# Patient Record
Sex: Female | Born: 1960 | Race: White | Hispanic: No | Marital: Married | Smoking: Never smoker
Health system: Southern US, Community
[De-identification: ages and names within clinical notes are randomized; demographics above are authoritative.]

## PROBLEM LIST (undated history)

## (undated) DIAGNOSIS — I10 Essential (primary) hypertension: Secondary | ICD-10-CM

## (undated) DIAGNOSIS — H509 Unspecified strabismus: Secondary | ICD-10-CM

## (undated) DIAGNOSIS — K56609 Unspecified intestinal obstruction, unspecified as to partial versus complete obstruction: Secondary | ICD-10-CM

## (undated) DIAGNOSIS — H4010X0 Unspecified open-angle glaucoma, stage unspecified: Secondary | ICD-10-CM

## (undated) DIAGNOSIS — E119 Type 2 diabetes mellitus without complications: Secondary | ICD-10-CM

## (undated) HISTORY — PX: ABDOMINAL HYSTERECTOMY: SHX81

---

## 1999-02-01 HISTORY — PX: VENTRAL HERNIA REPAIR: SHX424

## 2005-02-18 ENCOUNTER — Ambulatory Visit: Payer: Self-pay | Admitting: Unknown Physician Specialty

## 2007-08-02 ENCOUNTER — Ambulatory Visit: Payer: Self-pay | Admitting: Unknown Physician Specialty

## 2007-08-14 ENCOUNTER — Ambulatory Visit: Payer: Self-pay | Admitting: Unknown Physician Specialty

## 2008-02-07 ENCOUNTER — Ambulatory Visit: Payer: Self-pay | Admitting: Unknown Physician Specialty

## 2008-02-14 ENCOUNTER — Ambulatory Visit: Payer: Self-pay | Admitting: Unknown Physician Specialty

## 2008-08-14 ENCOUNTER — Ambulatory Visit: Payer: Self-pay | Admitting: Unknown Physician Specialty

## 2009-08-19 ENCOUNTER — Ambulatory Visit: Payer: Self-pay | Admitting: Unknown Physician Specialty

## 2009-10-14 ENCOUNTER — Emergency Department: Payer: Self-pay | Admitting: Emergency Medicine

## 2010-03-07 ENCOUNTER — Emergency Department: Payer: Self-pay | Admitting: Emergency Medicine

## 2010-10-13 ENCOUNTER — Ambulatory Visit: Payer: Self-pay | Admitting: Unknown Physician Specialty

## 2010-11-10 ENCOUNTER — Ambulatory Visit: Payer: Self-pay | Admitting: Unknown Physician Specialty

## 2012-01-19 ENCOUNTER — Ambulatory Visit: Payer: Self-pay | Admitting: Unknown Physician Specialty

## 2017-05-15 ENCOUNTER — Emergency Department: Payer: BC Managed Care – PPO

## 2017-05-15 ENCOUNTER — Inpatient Hospital Stay
Admission: EM | Admit: 2017-05-15 | Discharge: 2017-05-19 | DRG: 389 | Disposition: A | Discharge status: Home / Self Care | Payer: BC Managed Care – PPO | Attending: Surgery | Admitting: Surgery

## 2017-05-15 ENCOUNTER — Encounter: Payer: Self-pay | Admitting: Medical Oncology

## 2017-05-15 DIAGNOSIS — I10 Essential (primary) hypertension: Secondary | ICD-10-CM | POA: Diagnosis present

## 2017-05-15 DIAGNOSIS — Z79899 Other long term (current) drug therapy: Secondary | ICD-10-CM

## 2017-05-15 DIAGNOSIS — E119 Type 2 diabetes mellitus without complications: Secondary | ICD-10-CM | POA: Diagnosis present

## 2017-05-15 DIAGNOSIS — K56609 Unspecified intestinal obstruction, unspecified as to partial versus complete obstruction: Secondary | ICD-10-CM | POA: Diagnosis not present

## 2017-05-15 DIAGNOSIS — Z91010 Allergy to peanuts: Secondary | ICD-10-CM | POA: Diagnosis not present

## 2017-05-15 DIAGNOSIS — R1084 Generalized abdominal pain: Secondary | ICD-10-CM

## 2017-05-15 DIAGNOSIS — Z23 Encounter for immunization: Secondary | ICD-10-CM | POA: Diagnosis not present

## 2017-05-15 DIAGNOSIS — K432 Incisional hernia without obstruction or gangrene: Secondary | ICD-10-CM | POA: Diagnosis present

## 2017-05-15 DIAGNOSIS — Z6841 Body Mass Index (BMI) 40.0 and over, adult: Secondary | ICD-10-CM

## 2017-05-15 DIAGNOSIS — R109 Unspecified abdominal pain: Secondary | ICD-10-CM | POA: Diagnosis not present

## 2017-05-15 DIAGNOSIS — K5652 Intestinal adhesions [bands] with complete obstruction: Secondary | ICD-10-CM | POA: Diagnosis present

## 2017-05-15 DIAGNOSIS — Z7984 Long term (current) use of oral hypoglycemic drugs: Secondary | ICD-10-CM | POA: Diagnosis not present

## 2017-05-15 DIAGNOSIS — K436 Other and unspecified ventral hernia with obstruction, without gangrene: Secondary | ICD-10-CM

## 2017-05-15 HISTORY — DX: Type 2 diabetes mellitus without complications: E11.9

## 2017-05-15 HISTORY — DX: Essential (primary) hypertension: I10

## 2017-05-15 LAB — COMPREHENSIVE METABOLIC PANEL
ALBUMIN: 3.9 g/dL (ref 3.5–5.0)
ALT: 14 U/L (ref 14–54)
ANION GAP: 9 (ref 5–15)
AST: 20 U/L (ref 15–41)
Alkaline Phosphatase: 62 U/L (ref 38–126)
BUN: 16 mg/dL (ref 6–20)
CHLORIDE: 102 mmol/L (ref 101–111)
CO2: 25 mmol/L (ref 22–32)
CREATININE: 0.59 mg/dL (ref 0.44–1.00)
Calcium: 9.2 mg/dL (ref 8.9–10.3)
GFR calc non Af Amer: >60 mL/min (ref >60)
GLUCOSE: 165 mg/dL — AB (ref 65–99)
Potassium: 4.2 mmol/L (ref 3.5–5.1)
SODIUM: 136 mmol/L (ref 135–145)
Total Bilirubin: 0.6 mg/dL (ref 0.3–1.2)
Total Protein: 7.3 g/dL (ref 6.5–8.1)

## 2017-05-15 LAB — CBC
HCT: 40.2 % (ref 35.0–47.0)
HEMOGLOBIN: 13.6 g/dL (ref 12.0–16.0)
MCH: 31.9 pg (ref 26.0–34.0)
MCHC: 33.8 g/dL (ref 32.0–36.0)
MCV: 94.2 fL (ref 80.0–100.0)
Platelets: 227 10*3/uL (ref 150–440)
RBC: 4.27 MIL/uL (ref 3.80–5.20)
RDW: 13.4 % (ref 11.5–14.5)
WBC: 9.2 10*3/uL (ref 3.6–11.0)

## 2017-05-15 LAB — URINALYSIS, COMPLETE (UACMP) WITH MICROSCOPIC
BILIRUBIN URINE: NEGATIVE
Glucose, UA: 50 mg/dL — AB
Hgb urine dipstick: NEGATIVE
KETONES UR: 80 mg/dL — AB
Nitrite: NEGATIVE
PROTEIN: 30 mg/dL — AB
Specific Gravity, Urine: 1.035 — ABNORMAL HIGH (ref 1.005–1.030)
pH: 5 (ref 5.0–8.0)

## 2017-05-15 LAB — LIPASE, BLOOD: LIPASE: 27 U/L (ref 11–51)

## 2017-05-15 MED ORDER — MORPHINE SULFATE (PF) 4 MG/ML IV SOLN: 4.0000 mg | Freq: Once | INTRAVENOUS | Status: DC

## 2017-05-15 MED ORDER — LACTATED RINGERS IV SOLN
125.0000 mL/h | INTRAVENOUS | Status: DC
  Administered 2017-05-16 – 2017-05-17 (×5): 125 mL/h via INTRAVENOUS

## 2017-05-15 MED ORDER — ONDANSETRON HCL 4 MG/2ML IJ SOLN
INTRAMUSCULAR | Status: AC
  Administered 2017-05-15: 4 mg via INTRAVENOUS
  Filled 2017-05-15: qty 2

## 2017-05-15 MED ORDER — IOHEXOL 300 MG/ML  SOLN
125.0000 mL | Freq: Once | INTRAMUSCULAR | Status: AC | PRN
  Administered 2017-05-15: 125 mL via INTRAVENOUS

## 2017-05-15 MED ORDER — ONDANSETRON HCL 4 MG/2ML IJ SOLN: 4.0000 mg | Freq: Once | INTRAMUSCULAR | Status: DC

## 2017-05-15 MED ORDER — HYDROMORPHONE HCL 1 MG/ML IJ SOLN: 0.5000 mg | INTRAMUSCULAR | Status: DC | PRN

## 2017-05-15 MED ORDER — ONDANSETRON HCL 4 MG/2ML IJ SOLN: 4.0000 mg | Freq: Four times a day (QID) | INTRAMUSCULAR | Status: DC | PRN

## 2017-05-15 MED ORDER — MORPHINE SULFATE (PF) 4 MG/ML IV SOLN
INTRAVENOUS | Status: AC
  Administered 2017-05-15: 4 mg via INTRAVENOUS
  Filled 2017-05-15: qty 1

## 2017-05-15 MED ORDER — FAMOTIDINE IN NACL 20-0.9 MG/50ML-% IV SOLN
20.0000 mg | Freq: Two times a day (BID) | INTRAVENOUS | Status: DC
  Administered 2017-05-16 – 2017-05-18 (×7): 20 mg via INTRAVENOUS
  Filled 2017-05-15 (×7): qty 50

## 2017-05-15 MED ORDER — ENOXAPARIN SODIUM 40 MG/0.4ML ~~LOC~~ SOLN
40.0000 mg | SUBCUTANEOUS | Status: DC
  Administered 2017-05-16 – 2017-05-18 (×4): 40 mg via SUBCUTANEOUS
  Filled 2017-05-15 (×4): qty 0.4

## 2017-05-15 MED ORDER — ONDANSETRON 4 MG PO TBDP: 4.0000 mg | ORAL_TABLET | Freq: Four times a day (QID) | ORAL | Status: DC | PRN

## 2017-05-15 MED ORDER — LACTATED RINGERS IV BOLUS
1000.0000 mL | Freq: Once | INTRAVENOUS | Status: AC
  Administered 2017-05-15: 1000 mL via INTRAVENOUS
  Filled 2017-05-15: qty 1000

## 2017-05-15 MED ORDER — SODIUM CHLORIDE 0.9 % IV BOLUS
1000.0000 mL | Freq: Once | INTRAVENOUS | Status: AC
  Administered 2017-05-15: 1000 mL via INTRAVENOUS

## 2017-05-15 MED ORDER — MORPHINE SULFATE (PF) 4 MG/ML IV SOLN
4.0000 mg | Freq: Once | INTRAVENOUS | Status: AC
  Administered 2017-05-15: 4 mg via INTRAVENOUS

## 2017-05-15 MED ORDER — ONDANSETRON HCL 4 MG/2ML IJ SOLN
4.0000 mg | Freq: Once | INTRAMUSCULAR | Status: AC
  Administered 2017-05-15: 4 mg via INTRAVENOUS

## 2017-05-15 MED ORDER — HYDRALAZINE HCL 20 MG/ML IJ SOLN: 10.0000 mg | Freq: Four times a day (QID) | INTRAMUSCULAR | Status: DC | PRN

## 2017-05-15 MED ORDER — KETOROLAC TROMETHAMINE 30 MG/ML IJ SOLN
30.0000 mg | Freq: Four times a day (QID) | INTRAMUSCULAR | Status: DC
  Administered 2017-05-16 – 2017-05-17 (×7): 30 mg via INTRAVENOUS
  Filled 2017-05-15 (×7): qty 1

## 2017-05-15 MED ORDER — INSULIN ASPART 100 UNIT/ML ~~LOC~~ SOLN
0.0000 [IU] | SUBCUTANEOUS | Status: DC
  Administered 2017-05-16 (×2): 3 [IU] via SUBCUTANEOUS
  Filled 2017-05-15 (×2): qty 1

## 2017-05-15 NOTE — ED Triage Notes (Signed)
Pt reports she began having lower abd pain with vomiting Saturday night. Pt reports she "feels like everything is trapped" in her stomach. Last BM was this am but not normal.

## 2017-05-15 NOTE — ED Provider Notes (Signed)
[facility] Emergency Department Provider Note  ____________________________________________  Time seen: Approximately 7:21 PM  I have reviewed the triage vital signs and the nursing notes.   HISTORY  Chief Complaint Abdominal Pain; Emesis; and Constipation    HPI Holly Simon is a 57 y.o. female who complains of abdominal fullness and distention and pain. States that she has not been able to eat for the past 2 days. Decreased bowel movements. Vomiting. Symptoms are constant, waxing and waning, pain is worse with eating, no alleviating factors, nonradiating. Describes it worst is periumbilical in area of a previous hernia mesh repair.      Past Medical History:  Diagnosis Date  . Diabetes mellitus without complication (HCC)   . Hypertension      There are no active problems to display for this patient.    past surgical history Ventral hernia mesh repair   Prior to Admission medications   Medication Sig Start Date End Date Taking? Authorizing Provider  glimepiride (AMARYL) 2 MG tablet Take 2 mg by mouth 2 (two) times daily.   Yes [provider]  lisinopril (PRINIVIL,ZESTRIL) 10 MG tablet Take 10 mg by mouth daily.   Yes [provider]  Magnesium Oxide 250 MG TABS Take 250 mg by mouth 2 (two) times daily.   Yes [provider]  metFORMIN (GLUCOPHAGE) 1000 MG tablet Take 1,000-1,500 mg by mouth 2 (two) times daily with a meal. Take 1000 mg in the morning, 1500 mg at supper.   Yes [provider]  pioglitazone (ACTOS) 45 MG tablet Take 45 mg by mouth daily.   Yes [provider]  Potassium 99 MG TABS Take 1 tablet by mouth daily.   Yes [provider]     Allergies Peanut-containing drug products   No family history on file.  Social History Social History   Tobacco Use  . Smoking status: Not on file  Substance Use Topics  . Alcohol use: Not on file  . Drug use: Not on file     Review of Systems  Constitutional:   No fever or chills.  ENT:   No sore throat. No rhinorrhea. Cardiovascular:   No chest pain or syncope. Respiratory:   No dyspnea or cough. Gastrointestinal:   positive as above for abdominal pain and vomiting Musculoskeletal:   Negative for focal pain or swelling All other systems reviewed and are negative except as documented above in ROS and HPI.  ____________________________________________   PHYSICAL EXAM:  VITAL SIGNS: ED Triage Vitals [05/15/17 1214]  Enc Vitals Group     BP (!) 146/70     Pulse Rate 78     Resp 20     Temp 98.3 F (36.8 C)     Temp Source Oral     SpO2 100 %     Weight 300 lb (136.1 kg)     Height 5\' 4"  (1.626 m)     Head Circumference      Peak Flow      Pain Score 4     Pain Loc      Pain Edu?      Excl. in GC?     Vital signs reviewed, nursing assessments reviewed.   Constitutional:   Alert and oriented. not in distress. Eyes:   Conjunctivae are normal. EOMI. PERRL. ENT      Head:   Normocephalic and atraumatic.      Nose:   No congestion/rhinnorhea.  Mouth/Throat:   dry mucous membranes, no pharyngeal erythema. No peritonsillar mass.       Neck:   No meningismus. Full ROM. Hematological/Lymphatic/Immunilogical:   No cervical lymphadenopathy. Cardiovascular:   RRR. Symmetric bilateral radial and DP pulses.  No murmurs.  Respiratory:   Normal respiratory effort without tachypnea/retractions. Breath sounds are clear and equal bilaterally. No wheezes/rales/rhonchi. Gastrointestinal:   Soft with left lower quadrant tenderness. Morbidly obese. Non distended.   No rebound, rigidity, or guarding.there is a palpable mass in the central abdomen, hernia versus distended bowel loops.  Musculoskeletal:   Normal range of motion in all extremities. No joint effusions.  No lower extremity tenderness.  No edema. Neurologic:   Normal speech and language.  Motor grossly intact. No acute focal neurologic  deficits are appreciated.  Skin:    Skin is warm, dry and intact. No rash noted.  No petechiae, purpura, or bullae.  ____________________________________________    LABS (pertinent positives/negatives) (all labs ordered are listed, but only abnormal results are displayed) Labs Reviewed  COMPREHENSIVE METABOLIC PANEL - Abnormal; Notable for the following components:      Result Value   Glucose, Bld 165 (*)    All other components within normal limits  URINALYSIS, COMPLETE (UACMP) WITH MICROSCOPIC - Abnormal; Notable for the following components:   Color, Urine AMBER (*)    APPearance CLOUDY (*)    Specific Gravity, Urine 1.035 (*)    Glucose, UA 50 (*)    Ketones, ur 80 (*)    Protein, ur 30 (*)    Leukocytes, UA SMALL (*)    Bacteria, UA RARE (*)    Squamous Epithelial / LPF 0-5 (*)    Non Squamous Epithelial 0-5 (*)    All other components within normal limits  LIPASE, BLOOD  CBC   ____________________________________________   EKG    ____________________________________________    RADIOLOGY  Ct Abdomen Pelvis W Contrast  Result Date: 05/15/2017 CLINICAL DATA:  57 y/o  F; lower abdominal pain with vomiting. EXAM: CT ABDOMEN AND PELVIS WITH CONTRAST TECHNIQUE: Multidetector CT imaging of the abdomen and pelvis was performed using the standard protocol following bolus administration of intravenous contrast. CONTRAST:  OMNIPAQUE IOHEXOL 300 MG/ML  SOLN COMPARISON:  10/14/2009 CT abdomen and pelvis. FINDINGS: Lower chest: No acute abnormality. Hepatobiliary: No focal liver abnormality is seen. Cholelithiasis. No secondary signs of cholecystitis. No biliary ductal dilatation. Pancreas: Unremarkable. No pancreatic ductal dilatation or surrounding inflammatory changes. Spleen: Normal in size without focal abnormality. Adrenals/Urinary Tract: Adrenal glands are unremarkable. Kidneys are normal, without renal calculi, focal lesion, or hydronephrosis. Bladder is unremarkable.  Stomach/Bowel: Midline ventral anterior abdominal wall hernia with a neck measuring 4.9 x 3.7 cm (ML BY CC) containing multiple loops of small bowel. Loops of small bowel within the hernia and upstream in the peritoneum are dilated compatible with obstruction. There is a small volume of edema within the hernia sac. No findings of perforation. No obstructive or inflammatory changes of the colon. Appendix not identified. Vascular/Lymphatic: Aortic atherosclerosis. No enlarged abdominal or pelvic lymph nodes. Reproductive: Uterus and bilateral adnexa are unremarkable. Other: Small volume of ascites. Musculoskeletal: No fracture is seen. IMPRESSION: 1. Midline ventral abdominal wall hernia is increased in size containing multiple loops of small bowel. Small bowel within the hernia sac and upstream within the peritoneum is dilated compatible with obstruction. Mild associated edema. No perforation. 2. Cholelithiasis. 3. Aortic atherosclerosis. 4. Small volume of ascites. Electronically Signed   By: Buzzy Han.D.  On: 05/15/2017 19:24    ____________________________________________   PROCEDURES Procedures  ____________________________________________  DIFFERENTIAL DIAGNOSIS   bowel perforation, obstruction, hernia, diverticulitis  CLINICAL IMPRESSION / ASSESSMENT AND PLAN / ED COURSE  Pertinent labs & imaging results that were available during my care of the patient were reviewed by me and considered in my medical decision making (see chart for details).    patient with diabetes presents with abdominal pain and a ventral mass. Differential is broad given her underlying comorbidities, obesity, prior hernia surgery. There is evidence of dehydration based on exam as well as a urinalysis which shows ketones and urine concentration. We'll give IV fluids, obtain CT scan for further evaluation. On labs, no evidence of acidosis to suggest  DKA.   ----------------------------------------- 8:03 PM on 05/15/2017 -----------------------------------------  CT shows multiple loops of small intestine contained in ventral hernia sac with evidence of obstruction. This is compatible with patient's symptoms of worsening pain, by mouth intolerance, vomiting. Patient informed of results, we'll plan to place an NG tube and admit. Case discussed with surgery who will evaluate in the ED. I will give morphine and Zofran in the meantime for symptom control, IV fluid bolus for hydration. Based on CT and exam, presentation not currently consistent with strangulated hernia.      ____________________________________________   FINAL CLINICAL IMPRESSION(S) / ED DIAGNOSES    Final diagnoses:  Generalized abdominal pain  Ventral hernia with bowel obstruction     ED Discharge Orders    None      Portions of this note were generated with dragon dictation software. Dictation errors may occur despite best attempts at proofreading.    Sharman CheekStafford, Monque Haggar, MD 05/15/17 2004

## 2017-05-15 NOTE — H&P (Signed)
Date of Admission:  05/15/2017  Reason for Admission:  Small bowel obstruction  History of Present Illness: Holly Simon is a 57 y.o. female who presents with a 2-3 day history of nausea, vomiting, and some abdominal pain.  She reports that starting 4/13, she started feeling worse.  She reports decreased flatus and also decreased size bowel movements over the past two days.  Has not been able to tolerate po intake.  Denies having any fevers, chills, chest pain, shortness of breath.  She presented to the ED today and had labs and CT scan.  Labs overall were unremarkable, and CT scan showed a small bowel obstruction.  Patient has a ventral hernia with small bowel inside, with some dilated bowel loops going into the hernia, but decompressed coming out to terminal ileum.  NG tube was placed in the ED and the patient reports feeling much better now without any significant pain, and only some soreness.  Of note, patient has a history of a ventral hernia repair with mesh in 2001 in [place].  She has a CT scan from [facility] from 2011 which showed a recurrent hernia already with small bowel loops inside sac, though on today's CT scan there is a bit more bowel bulging through.  Past Medical History: Past Medical History:  Diagnosis Date  . Diabetes mellitus without complication (HCC)   . Hypertension      Past Surgical History: --Ventral hernia repair with mesh in 2001 --c section  Home Medications: Prior to Admission medications   Medication Sig Start Date End Date Taking? Authorizing Provider  glimepiride (AMARYL) 2 MG tablet Take 2 mg by mouth 2 (two) times daily.   Yes [provider]  lisinopril (PRINIVIL,ZESTRIL) 10 MG tablet Take 10 mg by mouth daily.   Yes [provider]  Magnesium Oxide 250 MG TABS Take 250 mg by mouth 2 (two) times daily.   Yes [provider]  metFORMIN (GLUCOPHAGE) 1000 MG tablet Take 1,000-1,500 mg by mouth 2 (two) times daily with a meal. Take  1000 mg in the morning, 1500 mg at supper.   Yes [provider]  pioglitazone (ACTOS) 45 MG tablet Take 45 mg by mouth daily.   Yes [provider]  Potassium 99 MG TABS Take 1 tablet by mouth daily.   Yes [provider]    Allergies: Allergies  Allergen Reactions  . Peanut-Containing Drug Products Nausea And Vomiting and Other (See Comments)    Bloating, Abdominal Pain.    Social History:  reports that she has never smoked. She has never used smokeless tobacco. She reports that she drank alcohol. She reports that she does not use drugs.   Family History: History of colon cancer, thyroid disease, macular degeneration, Alzheimer's, cardiac disease and MI.  Review of Systems: Review of Systems  Constitutional: Negative for chills and fever.  HENT: Negative for hearing loss.   Respiratory: Negative for shortness of breath.   Cardiovascular: Negative for chest pain.  Gastrointestinal: Positive for abdominal pain, nausea and vomiting. Negative for constipation and diarrhea.  Genitourinary: Negative for dysuria.  Musculoskeletal: Negative for myalgias.  Skin: Negative for rash.  Neurological: Negative for dizziness.  Psychiatric/Behavioral: Negative for depression.    Physical Exam BP (!) 152/78   Pulse 80   Temp 98.3 F (36.8 C) (Oral)   Resp 13   Ht 5\' 4"  (1.626 m)   Wt 300 lb (136.1 kg)   SpO2 96%   BMI 51.49 kg/m  CONSTITUTIONAL: No  acute distress HEENT:  Normocephalic, atraumatic, extraocular motion intact. NECK: Trachea is midline, and there is no jugular venous distension.  RESPIRATORY:  Lungs are clear, and breath sounds are equal bilaterally. Normal respiratory effort without pathologic use of accessory muscles. CARDIOVASCULAR: Heart is regular without murmurs, gallops, or rubs. GI: The abdomen is soft, obese, non-distended, non-tender to palpation.  Patient has a large ventral hernia with one area of some firmness to right of midline,  but no tenderness to palpation.  No skin discoloration or changes.  Prior hernia repair scar well healed. There were no palpable masses. NG tube in place with bilious gastric contents in canister. MUSCULOSKELETAL:  Normal muscle strength and tone in all four extremities.  No peripheral edema or cyanosis. SKIN: Skin turgor is normal. There are no pathologic skin lesions.  NEUROLOGIC:  Motor and sensation is grossly normal.  Cranial nerves are grossly intact. PSYCH:  Alert and oriented to person, place and time. Affect is normal.  Laboratory Analysis: Results for orders placed or performed during the hospital encounter of 05/15/17 (from the past 24 hour(s))  Lipase, blood     Status: None   Collection Time: 05/15/17  1:08 PM  Result Value Ref Range   Lipase 27 11 - 51 U/L  Comprehensive metabolic panel     Status: Abnormal   Collection Time: 05/15/17  1:08 PM  Result Value Ref Range   Sodium 136 135 - 145 mmol/L   Potassium 4.2 3.5 - 5.1 mmol/L   Chloride 102 101 - 111 mmol/L   CO2 25 22 - 32 mmol/L   Glucose, Bld 165 (H) 65 - 99 mg/dL   BUN 16 6 - 20 mg/dL   Creatinine, Ser 6.040.59 0.44 - 1.00 mg/dL   Calcium 9.2 8.9 - 54.010.3 mg/dL   Total Protein 7.3 6.5 - 8.1 g/dL   Albumin 3.9 3.5 - 5.0 g/dL   AST 20 15 - 41 U/L   ALT 14 14 - 54 U/L   Alkaline Phosphatase 62 38 - 126 U/L   Total Bilirubin 0.6 0.3 - 1.2 mg/dL   GFR calc non Af Amer >60 >60 mL/min   GFR calc Af Amer >60 >60 mL/min   Anion gap 9 5 - 15  CBC     Status: None   Collection Time: 05/15/17  1:08 PM  Result Value Ref Range   WBC 9.2 3.6 - 11.0 K/uL   RBC 4.27 3.80 - 5.20 MIL/uL   Hemoglobin 13.6 12.0 - 16.0 g/dL   HCT 98.140.2 19.135.0 - 47.847.0 %   MCV 94.2 80.0 - 100.0 fL   MCH 31.9 26.0 - 34.0 pg   MCHC 33.8 32.0 - 36.0 g/dL   RDW 29.513.4 62.111.5 - 30.814.5 %   Platelets 227 150 - 440 K/uL  Urinalysis, Complete w Microscopic     Status: Abnormal   Collection Time: 05/15/17  5:01 PM  Result Value Ref Range   Color, Urine AMBER (A)  YELLOW   APPearance CLOUDY (A) CLEAR   Specific Gravity, Urine 1.035 (H) 1.005 - 1.030   pH 5.0 5.0 - 8.0   Glucose, UA 50 (A) NEGATIVE mg/dL   Hgb urine dipstick NEGATIVE NEGATIVE   Bilirubin Urine NEGATIVE NEGATIVE   Ketones, ur 80 (A) NEGATIVE mg/dL   Protein, ur 30 (A) NEGATIVE mg/dL   Nitrite NEGATIVE NEGATIVE   Leukocytes, UA SMALL (A) NEGATIVE   RBC / HPF 0-5 0 - 5 RBC/hpf   WBC, UA TOO NUMEROUS TO COUNT  0 - 5 WBC/hpf   Bacteria, UA RARE (A) NONE SEEN   Squamous Epithelial / LPF 0-5 (A) NONE SEEN   Mucus PRESENT    Non Squamous Epithelial 0-5 (A) NONE SEEN    Imaging: Ct Abdomen Pelvis W Contrast  Result Date: 05/15/2017 CLINICAL DATA:  57 y/o  F; lower abdominal pain with vomiting. EXAM: CT ABDOMEN AND PELVIS WITH CONTRAST TECHNIQUE: Multidetector CT imaging of the abdomen and pelvis was performed using the standard protocol following bolus administration of intravenous contrast. CONTRAST:  OMNIPAQUE IOHEXOL 300 MG/ML  SOLN COMPARISON:  10/14/2009 CT abdomen and pelvis. FINDINGS: Lower chest: No acute abnormality. Hepatobiliary: No focal liver abnormality is seen. Cholelithiasis. No secondary signs of cholecystitis. No biliary ductal dilatation. Pancreas: Unremarkable. No pancreatic ductal dilatation or surrounding inflammatory changes. Spleen: Normal in size without focal abnormality. Adrenals/Urinary Tract: Adrenal glands are unremarkable. Kidneys are normal, without renal calculi, focal lesion, or hydronephrosis. Bladder is unremarkable. Stomach/Bowel: Midline ventral anterior abdominal wall hernia with a neck measuring 4.9 x 3.7 cm (ML BY CC) containing multiple loops of small bowel. Loops of small bowel within the hernia and upstream in the peritoneum are dilated compatible with obstruction. There is a small volume of edema within the hernia sac. No findings of perforation. No obstructive or inflammatory changes of the colon. Appendix not identified. Vascular/Lymphatic:  Aortic atherosclerosis. No enlarged abdominal or pelvic lymph nodes. Reproductive: Uterus and bilateral adnexa are unremarkable. Other: Small volume of ascites. Musculoskeletal: No fracture is seen. IMPRESSION: 1. Midline ventral abdominal wall hernia is increased in size containing multiple loops of small bowel. Small bowel within the hernia sac and upstream within the peritoneum is dilated compatible with obstruction. Mild associated edema. No perforation. 2. Cholelithiasis. 3. Aortic atherosclerosis. 4. Small volume of ascites. Electronically Signed   By: Mitzi Hansen M.D.   On: 05/15/2017 19:24    Assessment and Plan: This is a 57 y.o. female who presents with small bowel obstruction.  I have independently viewed the patient's imaging study and reviewed her laboratory studies as mentioned in HPI.  Discussed with the patient that she'd be admitted to the surgical team for management of her small bowel obstruction.  She will be NPO with IV fluid hydration.  NG tube will remain in place until return of bowel function.  She will also have appropriate pain and nausea control.  She takes 3 medications for her diabetes.  Will have her on high dose insulin sliding scale and may need hospitalist consult depending on how this can control her glucose.  Will have IV hydralazine for HTN.  Discussed with patient that currently there is no urgency for surgery given that she's not peritoneal and there is no evidence of strangulation of bowel inside hernia sac.  Will attempt conservative management.  The patient does understand that if there is no improvement with conservative management, she may require surgical intervention.  For now, continue NG tube and NPO.  Patient understands this plan and all of her questions have been answered.   Howie Ill, MD [facility]

## 2017-05-16 ENCOUNTER — Encounter: Payer: Self-pay | Admitting: *Deleted

## 2017-05-16 ENCOUNTER — Other Ambulatory Visit: Payer: Self-pay

## 2017-05-16 ENCOUNTER — Inpatient Hospital Stay: Payer: BC Managed Care – PPO

## 2017-05-16 LAB — BASIC METABOLIC PANEL
ANION GAP: 6 (ref 5–15)
BUN: 16 mg/dL (ref 6–20)
CO2: 25 mmol/L (ref 22–32)
Calcium: 8.5 mg/dL — ABNORMAL LOW (ref 8.9–10.3)
Chloride: 103 mmol/L (ref 101–111)
Creatinine, Ser: 0.64 mg/dL (ref 0.44–1.00)
GFR calc Af Amer: >60 mL/min (ref >60)
GLUCOSE: 133 mg/dL — AB (ref 65–99)
POTASSIUM: 3.3 mmol/L — AB (ref 3.5–5.1)
SODIUM: 134 mmol/L — AB (ref 135–145)

## 2017-05-16 LAB — CBC WITH DIFFERENTIAL/PLATELET
BASOS ABS: 0 10*3/uL (ref 0–0.1)
BASOS PCT: 0 %
Eosinophils Absolute: 0 10*3/uL (ref 0–0.7)
Eosinophils Relative: 0 %
HCT: 35.8 % (ref 35.0–47.0)
HEMOGLOBIN: 12.2 g/dL (ref 12.0–16.0)
Lymphocytes Relative: 23 %
Lymphs Abs: 1.8 10*3/uL (ref 1.0–3.6)
MCH: 32.1 pg (ref 26.0–34.0)
MCHC: 34.1 g/dL (ref 32.0–36.0)
MCV: 94.1 fL (ref 80.0–100.0)
Monocytes Absolute: 0.8 10*3/uL (ref 0.2–0.9)
Monocytes Relative: 10 %
NEUTROS ABS: 5.1 10*3/uL (ref 1.4–6.5)
NEUTROS PCT: 67 %
Platelets: 228 10*3/uL (ref 150–440)
RBC: 3.8 MIL/uL (ref 3.80–5.20)
RDW: 13.4 % (ref 11.5–14.5)
WBC: 7.8 10*3/uL (ref 3.6–11.0)

## 2017-05-16 LAB — GLUCOSE, CAPILLARY
GLUCOSE-CAPILLARY: 101 mg/dL — AB (ref 65–99)
GLUCOSE-CAPILLARY: 118 mg/dL — AB (ref 65–99)
GLUCOSE-CAPILLARY: 92 mg/dL (ref 65–99)
Glucose-Capillary: 105 mg/dL — ABNORMAL HIGH (ref 65–99)
Glucose-Capillary: 127 mg/dL — ABNORMAL HIGH (ref 65–99)
Glucose-Capillary: 130 mg/dL — ABNORMAL HIGH (ref 65–99)

## 2017-05-16 LAB — MAGNESIUM: MAGNESIUM: 1.7 mg/dL (ref 1.7–2.4)

## 2017-05-16 MED ORDER — PNEUMOCOCCAL VAC POLYVALENT 25 MCG/0.5ML IJ INJ
0.5000 mL | INJECTION | INTRAMUSCULAR | Status: AC
  Administered 2017-05-17: 0.5 mL via INTRAMUSCULAR
  Filled 2017-05-16: qty 0.5

## 2017-05-16 NOTE — Progress Notes (Signed)
SURGICAL PROGRESS NOTE (cpt [phone])  Hospital Day(s): 1.   Post op day(s):  Marland Kitchen   Interval History: Patient seen and examined, no acute events or new complaints overnight. Patient reports she started passing some flatus with minimal remaining peri-umbilical pain, expresses frustration with long-standing difficulty with weight loss, requests bariatric program seminar information. She otherwise denies N/V, fever/chills, CP, or SOB.  Review of Systems:  Constitutional: denies fever, chills  HEENT: denies cough or congestion  Respiratory: denies any shortness of breath  Cardiovascular: denies chest pain or palpitations  Gastrointestinal: abdominal pain, N/V, and bowel function as per interval history Genitourinary: denies burning with urination or urinary frequency Musculoskeletal: denies pain, decreased motor or sensation Integumentary: denies any other rashes or skin discolorations Neurological: denies HA or vision/hearing changes   Vital signs in last 24 hours: [min-max] current  Temp:  [98 F (36.7 C)-98.6 F (37 C)] 98 F (36.7 C) (04/16 1100) Pulse Rate:  [68-87] 77 (04/16 1100) Resp:  [11-21] 18 (04/16 1100) BP: (128-159)/(56-92) 128/59 (04/16 1100) SpO2:  [93 %-100 %] 94 % (04/16 1100) Weight:  [307 lb 5.1 oz (139.4 kg)] 307 lb 5.1 oz (139.4 kg) (04/16 0225)     Height: 5\' 4"  (162.6 cm) Weight: (!) 307 lb 5.1 oz (139.4 kg) BMI (Calculated): 52.73   Intake/Output this shift:  Total I/O In: 215 [I.V.:215] Out: 200 [Urine:200]   Intake/Output last 2 shifts:  @IOLAST2SHIFTS @   Physical Exam:  Constitutional: alert, cooperative and no distress  HENT: normocephalic without obvious abnormality  Eyes: PERRL, EOM's grossly intact and symmetric  Neuro: CN II - XII grossly intact and symmetric without deficit  Respiratory: breathing non-labored at rest  Cardiovascular: regular rate and sinus rhythm  Gastrointestinal: soft, obese, but non-distended, with mild peri-umbilical  tenderness with even deep palpation, easily reducible large ventral abdominal wall hernia Musculoskeletal: UE and LE FROM, motor and sensation grossly intact, NT   Labs:  CBC Latest Ref Rng & Units 05/16/2017 05/15/2017  WBC 3.6 - 11.0 K/uL 7.8 9.2  Hemoglobin 12.0 - 16.0 g/dL 19.1 47.8  Hematocrit 29.5 - 47.0 % 35.8 40.2  Platelets 150 - 440 K/uL 228 227   CMP Latest Ref Rng & Units 05/16/2017 05/15/2017  Glucose 65 - 99 mg/dL 621(H) 086(V)  BUN 6 - 20 mg/dL 16 16  Creatinine 7.84 - 1.00 mg/dL 6.96 2.95  Sodium 284 - 145 mmol/L 134(L) 136  Potassium 3.5 - 5.1 mmol/L 3.3(L) 4.2  Chloride 101 - 111 mmol/L 103 102  CO2 22 - 32 mmol/L 25 25  Calcium 8.9 - 10.3 mg/dL 1.3(K) 9.2  Total Protein 6.5 - 8.1 g/dL - 7.3  Total Bilirubin 0.3 - 1.2 mg/dL - 0.6  Alkaline Phos 38 - 126 U/L - 62  AST 15 - 41 U/L - 20  ALT 14 - 54 U/L - 14   Imaging studies:  Abdominal X-ray (05/16/2017) - personally reviewed, compared to admission CT, and discussed with patient Mildly dilated loops of central small bowel up to 4.3 cm. Residual contrast within the renal collecting systems. Scattered colon gas is noted. Bowel loops in the left lower quadrant lateral to pelvic bones, corresponding to ventral hernia noted on CT.   Assessment/Plan: (ICD-10's: K86.52) 57 y.o. 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  - NPO for now, IV fluids             -  continue NG tube for nasogastric decompression             - monitor ongoing bowel function and abdominal exam              - anticipate symptomatic relief within 24 - 48 hours following NGT insertion, followed by "rumbling" the following day and flatus either the same day or the day following the "rumbling" with anticipated length of stay ~3  - 5 days with successful non-operative management for 8 of 10 patients with small bowel obstruction attributed to post-surgical adhesions  - surgical intervention if doesn't improve was also discussed             - medical management of medical comorbidities             - ambulation encouraged              - DVT prophylaxis  -- Barbara CowerJason E. Earlene Plateravis, MD, RPVI Rami[place]o: [facility] - Partnering for exceptional care. Office: [phone]

## 2017-05-17 LAB — GLUCOSE, CAPILLARY
GLUCOSE-CAPILLARY: 84 mg/dL (ref 65–99)
Glucose-Capillary: 78 mg/dL (ref 65–99)
Glucose-Capillary: 76 mg/dL (ref 65–99)
Glucose-Capillary: 79 mg/dL (ref 65–99)
Glucose-Capillary: 85 mg/dL (ref 65–99)
Glucose-Capillary: 129 mg/dL — ABNORMAL HIGH (ref 65–99)

## 2017-05-17 LAB — HIV ANTIBODY (ROUTINE TESTING W REFLEX): HIV SCREEN 4TH GENERATION: NONREACTIVE

## 2017-05-17 MED ORDER — INSULIN ASPART 100 UNIT/ML ~~LOC~~ SOLN
0.0000 [IU] | Freq: Three times a day (TID) | SUBCUTANEOUS | Status: DC
  Administered 2017-05-19: 3 [IU] via SUBCUTANEOUS
  Filled 2017-05-17: qty 1

## 2017-05-17 MED ORDER — PHENOL 1.4 % MT LIQD
1.0000 | OROMUCOSAL | Status: DC | PRN
  Administered 2017-05-17: 1 via OROMUCOSAL
  Filled 2017-05-17: qty 177

## 2017-05-17 MED ORDER — POTASSIUM CHLORIDE IN NACL 20-0.45 MEQ/L-% IV SOLN
INTRAVENOUS | Status: DC
  Administered 2017-05-17 – 2017-05-19 (×3): via INTRAVENOUS
  Filled 2017-05-17 (×6): qty 1000

## 2017-05-17 NOTE — Progress Notes (Signed)
SURGICAL PROGRESS NOTE (cpt [id number])  Hospital Day(s): 2.   Post op day(s):  Marland Kitchen.   Interval History: Patient seen and examined, no acute events or new complaints overnight. Patient reports ongoing +flatus with resolution of nausea and abdominal pain, denies fever/chills, CP, or SOB and again expresses interest in bariatric information.  Review of Systems:  Constitutional: denies fever, chills  HEENT: denies cough or congestion  Respiratory: denies any shortness of breath  Cardiovascular: denies chest pain or palpitations  Gastrointestinal: abdominal pain, N/V, and bowel function as per interval history Genitourinary: denies burning with urination or urinary frequency Musculoskeletal: denies pain, decreased motor or sensation Integumentary: denies any other rashes or skin discolorations Neurological: denies HA or vision/hearing changes   Vital signs in last 24 hours: [min-max] current  Temp:  [98 F (36.7 C)-98.5 F (36.9 C)] 98.5 F (36.9 C) (04/17 0546) Pulse Rate:  [67-77] 67 (04/17 0546) Resp:  [18-20] 20 (04/17 0546) BP: (122-144)/(59-88) 122/71 (04/17 0546) SpO2:  [94 %-100 %] 95 % (04/17 0546)     Height: 5\' 4"  (162.6 cm) Weight: (!) 307 lb 5.1 oz (139.4 kg) BMI (Calculated): 52.73   Intake/Output this shift:  Total I/O In: 1341 [I.V.:1341] Out: -    Intake/Output last 2 shifts:  @IOLAST2SHIFTS @   Physical Exam:  Constitutional: alert, cooperative and no distress  HENT: normocephalic without obvious abnormality  Eyes: PERRL, EOM's grossly intact and symmetric  Neuro: CN II - XII grossly intact and symmetric without deficit  Respiratory: breathing non-labored at rest  Cardiovascular: regular rate and sinus rhythm  Gastrointestinal: soft and morbidly obese, non-tender and non-distended with reducible large ventral hernia Musculoskeletal: UE and LE FROM, motor and sensation grossly intact, NT   Labs:  CBC Latest Ref Rng & Units 05/16/2017 05/15/2017  WBC 3.6 - 11.0  K/uL 7.8 9.2  Hemoglobin 12.0 - 16.0 g/dL 60.412.2 54.013.6  Hematocrit 98.135.0 - 47.0 % 35.8 40.2  Platelets 150 - 440 K/uL 228 227   CMP Latest Ref Rng & Units 05/16/2017 05/15/2017  Glucose 65 - 99 mg/dL 191(Y133(H) 782(N165(H)  BUN 6 - 20 mg/dL 16 16  Creatinine 5.620.44 - 1.00 mg/dL 1.300.64 8.650.59  Sodium 784135 - 145 mmol/L 134(L) 136  Potassium 3.5 - 5.1 mmol/L 3.3(L) 4.2  Chloride 101 - 111 mmol/L 103 102  CO2 22 - 32 mmol/L 25 25  Calcium 8.9 - 10.3 mg/dL 6.9(G8.5(L) 9.2  Total Protein 6.5 - 8.1 g/dL - 7.3  Total Bilirubin 0.3 - 1.2 mg/dL - 0.6  Alkaline Phos 38 - 126 U/L - 62  AST 15 - 41 U/L - 20  ALT 14 - 54 U/L - 14   Imaging studies: No new pertinent imaging studies   Assessment/Plan: (ICD-10's: K56.52) 57 y.o. femalewith resolving complete small bowel obstruction, likely attributable to post-surgical adhesions following c-section and remote open repair of large ventral hernia with mesh (2001), complicated by pertinent comorbidities including morbid obesity (BMI 53), DM, and HTN.  - remove NG tube and start clear liquids diet - monitor ongoing bowel function and abdominal exam  - medical management of medical comorbidities - DVT prophylaxis, ambulation encouraged  All of the above findings and recommendations were discussed with the patient and her RN, and all of patient's questions were answered to her expressed satisfaction.  -- Scherrie GerlachJason E. Earlene Plateravis, MD, RPVI [place]: [facility] General Surgery - Partnering for exceptional care. Office: [phone]

## 2017-05-18 LAB — GLUCOSE, CAPILLARY
GLUCOSE-CAPILLARY: 88 mg/dL (ref 65–99)
GLUCOSE-CAPILLARY: 98 mg/dL (ref 65–99)
GLUCOSE-CAPILLARY: 156 mg/dL — AB (ref 65–99)
Glucose-Capillary: 112 mg/dL — ABNORMAL HIGH (ref 65–99)

## 2017-05-18 NOTE — Progress Notes (Deleted)
Patient scheduled to have EGD performed 05/19/17, but is stating that she can not tolerate the golytely before attempting to drink any. Patient given anti- nausea med via IV and told by nurse to see if that works and she can tolerate the golytely. New orders given for wound care under breast from wound care nurse refer to orders.

## 2017-05-19 DIAGNOSIS — K5652 Intestinal adhesions [bands] with complete obstruction: Principal | ICD-10-CM

## 2017-05-19 LAB — GLUCOSE, CAPILLARY: Glucose-Capillary: 132 mg/dL — ABNORMAL HIGH (ref 65–99)

## 2017-05-19 NOTE — Progress Notes (Signed)
Holly Simon  A and O x 4. VSS. Pt tolerating diet well. No complaints of pain or nausea. IV removed intact, prescriptions given. Pt voiced understanding of discharge instructions with no further questions. Pt discharged via wheelchair with nurse aide.  Holly FeilAbbie Rosalea Withrow MSN, RN-BC  Allergies as of 05/19/2017      Reactions   Peanut-containing Drug Products Nausea And Vomiting, Other (See Comments)   Bloating, Abdominal Pain.      Medication List    TAKE these medications   glimepiride 2 MG tablet Commonly known as:  AMARYL Take 2 mg by mouth 2 (two) times daily.   lisinopril 10 MG tablet Commonly known as:  PRINIVIL,ZESTRIL Take 10 mg by mouth daily.   Magnesium Oxide 250 MG Tabs Take 250 mg by mouth 2 (two) times daily.   metFORMIN 1000 MG tablet Commonly known as:  GLUCOPHAGE Take 1,000-1,500 mg by mouth 2 (two) times daily with a meal. Take 1000 mg in the morning, 1500 mg at supper.   pioglitazone 45 MG tablet Commonly known as:  ACTOS Take 45 mg by mouth daily.   Potassium 99 MG Tabs Take 1 tablet by mouth daily.       Vitals:   05/19/17 0501 05/19/17 0757  BP: 140/62   Pulse: 90   Resp: 16   Temp: (!) 100.4 F (38 C) 98.6 F (37 C)  SpO2: 95%

## 2017-05-20 NOTE — Discharge Summary (Signed)
Physician Discharge Summary  Patient ID: Holly Simon MRN: [id number] DOB/AGE: 1960/08/13 57 y.o.  Admit date: 05/15/2017 Discharge date: 05/20/2017  Admission Diagnoses:  Discharge Diagnoses:  Active Problems:   Small bowel obstruction [facility](HCC)   Discharged Condition: good  Hospital Course: 57 y.o. female [facility] ED for abdominal pain and nausea. Workup was found to be significant for CT imaging demonstrating small bowel obstruction attributable to post-surgical intra-abdominal adhesions with non-obstructing ventral abdominal wall hernia. NG tube was placed, and patient's pain, nausea, and abdominal distention all improved/resolved and advancement of patient's diet and ambulation were well-tolerated. The remainder of patient's hospital course was essentially unremarkable, and discharge planning was initiated accordingly with patient safely able to be discharged home with appropriate discharge instructions after all of her questions were answered to her expressed satisfaction. Of note, patient requested information regarding bariatric weight loss program (including surgery), and this was provided.  Consults: None  Significant Diagnostic Studies: radiology: CT scan: small bowel obstruction attributable to post-surgical intra-abdominal adhesions  Treatments: IV hydration and procedures: nasogastric tube decompression  Discharge Exam: Blood pressure 140/62, pulse 90, temperature 98.6 F (37 C), temperature source Oral, resp. rate 16, height 5\' 4"  (1.626 m), weight (!) 307 lb 5.1 oz (139.4 kg), SpO2 95 %. General appearance: alert, cooperative and no distress GI: obese with abdomen soft, completely non-tender, non-distended, and with reducible recurrent ventral abdominal wall hernia  Disposition:    Allergies as of 05/19/2017      Reactions   Peanut-containing Drug Products Nausea And Vomiting, Other (See Comments)   Bloating, Abdominal Pain.      Medication List    TAKE  these medications   glimepiride 2 MG tablet Commonly known as:  AMARYL Take 2 mg by mouth 2 (two) times daily.   lisinopril 10 MG tablet Commonly known as:  PRINIVIL,ZESTRIL Take 10 mg by mouth daily.   Magnesium Oxide 250 MG Tabs Take 250 mg by mouth 2 (two) times daily.   metFORMIN 1000 MG tablet Commonly known as:  GLUCOPHAGE Take 1,000-1,500 mg by mouth 2 (two) times daily with a meal. Take 1000 mg in the morning, 1500 mg at supper.   pioglitazone 45 MG tablet Commonly known as:  ACTOS Take 45 mg by mouth daily.   Potassium 99 MG Tabs Take 1 tablet by mouth daily.      Follow-up Information    Ancil Linseyavis, Kareemah Grounds Evan, MD Follow up.   Specialty:  General Surgery Why:  You do Not need to make a surgical follow-up appointment, but certainly feel free to call if any surgical questions or concerns. Contact information: [address] [place] [id number] [phone]        Leotis ShamesSingh, Jasmine, MD Follow up.   Specialty:  Internal Medicine Contact information: [address] [facility] [place] [id number] [phone]           Signed: Ancil LinseyJason Evan Katee Wentland 05/20/2017, 1:20 PM

## 2017-05-22 ENCOUNTER — Telehealth: Payer: Self-pay

## 2017-05-22 NOTE — Telephone Encounter (Signed)
Flagged on EMMI report for not having a follow up scheduled and for not knowing who to call with changes in condition.   Called and spoke with patient.  She mentioned she was able to schedule a follow up appointment with her doctor's office (not her regular provider, but one of their associates) this Wednesday (4/24).  As for not knowing who to call about changes in condition, she mentioned she had some confusion on the question since it occurred over the Easter holiday when offices were closed.  She is aware to call her doctor's office if she notices any changes in her condition.   No other questions or concerns at this time.  I thanked her for her feedback and her time, as well as informed her that she would receive one more automated call in the next few days checking on her.

## 2017-05-31 DIAGNOSIS — K5652 Intestinal adhesions [bands] with complete obstruction: Secondary | ICD-10-CM

## 2018-12-07 ENCOUNTER — Other Ambulatory Visit: Payer: Self-pay | Admitting: *Deleted

## 2018-12-07 DIAGNOSIS — Z20822 Contact with and (suspected) exposure to covid-19: Secondary | ICD-10-CM

## 2018-12-09 LAB — NOVEL CORONAVIRUS, NAA: SARS-CoV-2, NAA: DETECTED — AB

## 2019-02-08 ENCOUNTER — Other Ambulatory Visit: Payer: Self-pay | Admitting: Internal Medicine

## 2019-02-08 DIAGNOSIS — Z1231 Encounter for screening mammogram for malignant neoplasm of breast: Secondary | ICD-10-CM

## 2019-07-12 ENCOUNTER — Inpatient Hospital Stay
Admission: EM | Admit: 2019-07-12 | Discharge: 2019-07-18 | DRG: 389 | Disposition: A | Discharge status: Home / Self Care | Payer: BC Managed Care – PPO | Attending: General Surgery | Admitting: General Surgery

## 2019-07-12 ENCOUNTER — Inpatient Hospital Stay: Payer: BC Managed Care – PPO

## 2019-07-12 ENCOUNTER — Other Ambulatory Visit: Payer: Self-pay

## 2019-07-12 ENCOUNTER — Encounter: Payer: Self-pay | Admitting: Emergency Medicine

## 2019-07-12 ENCOUNTER — Emergency Department: Payer: BC Managed Care – PPO

## 2019-07-12 DIAGNOSIS — Z7984 Long term (current) use of oral hypoglycemic drugs: Secondary | ICD-10-CM

## 2019-07-12 DIAGNOSIS — Z4659 Encounter for fitting and adjustment of other gastrointestinal appliance and device: Secondary | ICD-10-CM

## 2019-07-12 DIAGNOSIS — I10 Essential (primary) hypertension: Secondary | ICD-10-CM | POA: Diagnosis present

## 2019-07-12 DIAGNOSIS — E119 Type 2 diabetes mellitus without complications: Secondary | ICD-10-CM | POA: Diagnosis present

## 2019-07-12 DIAGNOSIS — Z6841 Body Mass Index (BMI) 40.0 and over, adult: Secondary | ICD-10-CM

## 2019-07-12 DIAGNOSIS — K5651 Intestinal adhesions [bands], with partial obstruction: Principal | ICD-10-CM | POA: Diagnosis present

## 2019-07-12 DIAGNOSIS — K439 Ventral hernia without obstruction or gangrene: Secondary | ICD-10-CM | POA: Diagnosis present

## 2019-07-12 DIAGNOSIS — Z79899 Other long term (current) drug therapy: Secondary | ICD-10-CM

## 2019-07-12 DIAGNOSIS — K56609 Unspecified intestinal obstruction, unspecified as to partial versus complete obstruction: Secondary | ICD-10-CM

## 2019-07-12 DIAGNOSIS — Z90710 Acquired absence of both cervix and uterus: Secondary | ICD-10-CM | POA: Diagnosis not present

## 2019-07-12 DIAGNOSIS — Z20822 Contact with and (suspected) exposure to covid-19: Secondary | ICD-10-CM | POA: Diagnosis present

## 2019-07-12 DIAGNOSIS — Z91010 Allergy to peanuts: Secondary | ICD-10-CM | POA: Diagnosis not present

## 2019-07-12 DIAGNOSIS — K56600 Partial intestinal obstruction, unspecified as to cause: Secondary | ICD-10-CM | POA: Diagnosis not present

## 2019-07-12 HISTORY — DX: Unspecified intestinal obstruction, unspecified as to partial versus complete obstruction: K56.609

## 2019-07-12 LAB — COMPREHENSIVE METABOLIC PANEL
ALT: 13 U/L (ref 0–44)
AST: 17 U/L (ref 15–41)
Albumin: 4.1 g/dL (ref 3.5–5.0)
Alkaline Phosphatase: 60 U/L (ref 38–126)
Anion gap: 11 (ref 5–15)
BUN: 20 mg/dL (ref 8–23)
CO2: 22 mmol/L (ref 22–32)
Calcium: 9.1 mg/dL (ref 8.9–10.3)
Chloride: 100 mmol/L (ref 98–111)
Creatinine, Ser: 0.58 mg/dL (ref 0.44–1.00)
GFR calc Af Amer: >60 mL/min (ref >60)
GFR calc non Af Amer: >60 mL/min (ref >60)
Glucose, Bld: 218 mg/dL — ABNORMAL HIGH (ref 70–99)
Potassium: 4.1 mmol/L (ref 3.5–5.1)
Sodium: 133 mmol/L — ABNORMAL LOW (ref 135–145)
Total Bilirubin: 0.8 mg/dL (ref 0.3–1.2)
Total Protein: 7.5 g/dL (ref 6.5–8.1)

## 2019-07-12 LAB — CBC
HCT: 38.4 % (ref 36.0–46.0)
Hemoglobin: 13.5 g/dL (ref 12.0–15.0)
MCH: 32.1 pg (ref 26.0–34.0)
MCHC: 35.2 g/dL (ref 30.0–36.0)
MCV: 91.2 fL (ref 80.0–100.0)
Platelets: 223 10*3/uL (ref 150–400)
RBC: 4.21 MIL/uL (ref 3.87–5.11)
RDW: 12.6 % (ref 11.5–15.5)
WBC: 13.4 10*3/uL — ABNORMAL HIGH (ref 4.0–10.5)
nRBC: 0 % (ref 0.0–0.2)

## 2019-07-12 LAB — SARS CORONAVIRUS 2 BY RT PCR (HOSPITAL ORDER, PERFORMED IN ~~LOC~~ HOSPITAL LAB): SARS Coronavirus 2: NEGATIVE

## 2019-07-12 LAB — LACTIC ACID, PLASMA: Lactic Acid, Venous: 1.1 mmol/L (ref 0.5–1.9)

## 2019-07-12 LAB — GLUCOSE, CAPILLARY: Glucose-Capillary: 176 mg/dL — ABNORMAL HIGH (ref 70–99)

## 2019-07-12 LAB — LIPASE, BLOOD: Lipase: 28 U/L (ref 11–51)

## 2019-07-12 MED ORDER — HYDROMORPHONE HCL 1 MG/ML IJ SOLN: 0.5000 mg | INTRAMUSCULAR | Status: DC | PRN

## 2019-07-12 MED ORDER — SODIUM CHLORIDE 0.9 % IV SOLN: INTRAVENOUS | Status: DC

## 2019-07-12 MED ORDER — ONDANSETRON 4 MG PO TBDP: 4.0000 mg | ORAL_TABLET | Freq: Four times a day (QID) | ORAL | Status: DC | PRN

## 2019-07-12 MED ORDER — ONDANSETRON HCL 4 MG/2ML IJ SOLN: 4.0000 mg | Freq: Once | INTRAMUSCULAR | Status: AC

## 2019-07-12 MED ORDER — ENOXAPARIN SODIUM 40 MG/0.4ML ~~LOC~~ SOLN
40.0000 mg | SUBCUTANEOUS | Status: DC
  Administered 2019-07-12 – 2019-07-15 (×4): 40 mg via SUBCUTANEOUS
  Filled 2019-07-12 (×4): qty 0.4

## 2019-07-12 MED ORDER — ONDANSETRON 4 MG PO TBDP: 4.0000 mg | ORAL_TABLET | Freq: Once | ORAL | Status: DC | PRN

## 2019-07-12 MED ORDER — IOHEXOL 300 MG/ML  SOLN
100.0000 mL | Freq: Once | INTRAMUSCULAR | Status: AC | PRN
  Administered 2019-07-12: 100 mL via INTRAVENOUS

## 2019-07-12 MED ORDER — ONDANSETRON HCL 4 MG/2ML IJ SOLN
4.0000 mg | Freq: Once | INTRAMUSCULAR | Status: AC
  Administered 2019-07-12: 4 mg via INTRAVENOUS
  Filled 2019-07-12: qty 2

## 2019-07-12 MED ORDER — ACETAMINOPHEN 325 MG PO TABS
650.0000 mg | ORAL_TABLET | Freq: Four times a day (QID) | ORAL | Status: DC | PRN
  Administered 2019-07-13 (×2): 650 mg via ORAL
  Filled 2019-07-12 (×2): qty 2

## 2019-07-12 MED ORDER — ONDANSETRON HCL 4 MG/2ML IJ SOLN
INTRAMUSCULAR | Status: AC
  Administered 2019-07-12: 4 mg via INTRAVENOUS
  Filled 2019-07-12: qty 2

## 2019-07-12 MED ORDER — LISINOPRIL 10 MG PO TABS
10.0000 mg | ORAL_TABLET | Freq: Every day | ORAL | Status: DC
  Administered 2019-07-13 – 2019-07-17 (×5): 10 mg via ORAL
  Filled 2019-07-12 (×5): qty 1

## 2019-07-12 MED ORDER — SODIUM CHLORIDE 0.9 % IV BOLUS
1000.0000 mL | Freq: Once | INTRAVENOUS | Status: AC
  Administered 2019-07-12: 1000 mL via INTRAVENOUS

## 2019-07-12 MED ORDER — INSULIN ASPART 100 UNIT/ML ~~LOC~~ SOLN
0.0000 [IU] | SUBCUTANEOUS | Status: DC
  Administered 2019-07-12: 4 [IU] via SUBCUTANEOUS
  Administered 2019-07-13 – 2019-07-17 (×7): 3 [IU] via SUBCUTANEOUS
  Filled 2019-07-12 (×8): qty 1

## 2019-07-12 MED ORDER — POTASSIUM 99 MG PO TABS: 1.0000 | ORAL_TABLET | Freq: Every day | ORAL | Status: DC

## 2019-07-12 MED ORDER — SODIUM CHLORIDE 0.9% FLUSH: 3.0000 mL | Freq: Once | INTRAVENOUS | Status: DC

## 2019-07-12 MED ORDER — ONDANSETRON HCL 4 MG/2ML IJ SOLN: 4.0000 mg | Freq: Four times a day (QID) | INTRAMUSCULAR | Status: DC | PRN

## 2019-07-12 MED ORDER — KETOROLAC TROMETHAMINE 30 MG/ML IJ SOLN
30.0000 mg | Freq: Four times a day (QID) | INTRAMUSCULAR | Status: AC | PRN
  Administered 2019-07-16: 30 mg via INTRAVENOUS
  Filled 2019-07-12: qty 1

## 2019-07-12 MED ORDER — MAGNESIUM OXIDE 400 (241.3 MG) MG PO TABS
200.0000 mg | ORAL_TABLET | Freq: Two times a day (BID) | ORAL | Status: DC
  Administered 2019-07-13 – 2019-07-17 (×9): 200 mg via ORAL
  Filled 2019-07-12 (×10): qty 1

## 2019-07-12 MED ORDER — SIMETHICONE 80 MG PO CHEW
40.0000 mg | CHEWABLE_TABLET | Freq: Four times a day (QID) | ORAL | Status: DC | PRN
  Filled 2019-07-12: qty 1

## 2019-07-12 MED ORDER — ACETAMINOPHEN 650 MG RE SUPP: 650.0000 mg | Freq: Four times a day (QID) | RECTAL | Status: DC | PRN

## 2019-07-12 NOTE — ED Provider Notes (Signed)
[facility] Emergency Department Provider Note   ____________________________________________   I have reviewed the triage vital signs and the nursing notes.   HISTORY  Chief Complaint Abdominal Pain and Emesis   History limited by: Not Limited   HPI Holly Simon is a 59 y.o. female who presents to the emergency department today because of concern for abdominal pain, nausea and vomiting. The patient states that her symptoms started 2 days ago.  Initially the discomfort in her abdomen was in the left lower quadrant but now is moved across the whole lower abdomen.  She is unsure if it is somewhat related to her vomiting now.  The patient states that she has had multiple episodes of vomiting.  Is bitter taste and brown.  She continues to have bowel movements.  She states that symptoms do somewhat remind her of when she had a small bowel obstruction although unlike that time she is still having the bowel movements.  The patient denies any fevers.  Denies any unusual ingestions.   Records reviewed. Per medical record review patient has a history of bowel obstruction.   Past Medical History:  Diagnosis Date  . Bowel obstruction ([place])   . Diabetes mellitus without complication ([place])   . Hypertension     Patient Active Problem List   Diagnosis Date Noted  . Intestinal adhesions with complete obstruction ([place])   . Small bowel obstruction ([place]) 05/15/2017    Past Surgical History:  Procedure Laterality Date  . VENTRAL HERNIA REPAIR  2001    Prior to Admission medications   Medication Sig Start Date End Date Taking? Authorizing Provider  glimepiride (AMARYL) 2 MG tablet Take 2 mg by mouth 2 (two) times daily.    [provider]  lisinopril (PRINIVIL,ZESTRIL) 10 MG tablet Take 10 mg by mouth daily.    [provider]  Magnesium Oxide 250 MG TABS Take 250 mg by mouth 2 (two) times daily.    [provider]  metFORMIN (GLUCOPHAGE) 1000  MG tablet Take 1,000-1,500 mg by mouth 2 (two) times daily with a meal. Take 1000 mg in the morning, 1500 mg at supper.    [provider]  pioglitazone (ACTOS) 45 MG tablet Take 45 mg by mouth daily.    [provider]  Potassium 99 MG TABS Take 1 tablet by mouth daily.    [provider]    Allergies Peanut-containing drug products  No family history on file.  Social History Social History   Tobacco Use  . Smoking status: Never Smoker  . Smokeless tobacco: Never Used  Substance Use Topics  . Alcohol use: Not Currently  . Drug use: Never    Review of Systems Constitutional: No fever/chills Eyes: No visual changes. ENT: No sore throat. Cardiovascular: Denies chest pain. Respiratory: Denies shortness of breath. Gastrointestinal: Positive for abdominal pain, nausea and vomiting.   Genitourinary: Negative for dysuria. Musculoskeletal: Negative for back pain. Skin: Negative for rash. Neurological: Negative for headaches, focal weakness or numbness.  ____________________________________________   PHYSICAL EXAM:  VITAL SIGNS: ED Triage Vitals [07/12/19 1136]  Enc Vitals Group     BP (!) 143/73     Pulse Rate 66     Resp 16     Temp 98.9 F (37.2 C)     Temp Source Oral     SpO2 97 %     Weight 293 lb (132.9 kg)     Height 5\' 5"  (1.651 m)  Head Circumference      Peak Flow      Pain Score 7   Constitutional: Alert and oriented.  Eyes: Conjunctivae are normal.  ENT      Head: Normocephalic and atraumatic.      Nose: No congestion/rhinnorhea.      Mouth/Throat: Mucous membranes are moist.      Neck: No stridor. Hematological/Lymphatic/Immunilogical: No cervical lymphadenopathy. Cardiovascular: Normal rate, regular rhythm.  No murmurs, rubs, or gallops.  Respiratory: Normal respiratory effort without tachypnea nor retractions. Breath sounds are clear and equal bilaterally. No wheezes/rales/rhonchi. Gastrointestinal: Soft and slightly  tender to palpation.  Firm mass felt in the anterior abdomen. Genitourinary: Deferred Musculoskeletal: Normal range of motion in all extremities. No lower extremity edema. Neurologic:  Normal speech and language. No gross focal neurologic deficits are appreciated.  Skin:  Skin is warm, dry and intact. No rash noted. Psychiatric: Mood and affect are normal. Speech and behavior are normal. Patient exhibits appropriate insight and judgment.  ____________________________________________    LABS (pertinent positives/negatives)  Lipase 28 CBC wbc 13.4, hgb 13.5, plt 223 CMP wnl except na 133, glu 218  ____________________________________________   EKG  None  ____________________________________________    RADIOLOGY  CT abd pel Anterior abdominal wall hernia with bowel and findings concerning for incarceration and proximal bowel obstruction  ____________________________________________   PROCEDURES  Procedures  ____________________________________________   INITIAL IMPRESSION / ASSESSMENT AND PLAN / ED COURSE  Pertinent labs & imaging results that were available during my care of the patient were reviewed by me and considered in my medical decision making (see chart for details).   Patient presented to the emergency department today because of concerns for abdominal pain, nausea and vomiting.  Patient states she has a history of small bowel obstruction in the past although she does state that she is continuing to have bowel movements.  CT scan does show a abdominal wall hernia with bowel concerning for possible incarceration as well as proximal small bowel obstruction.  Discussed with Dr. Lady Gary with surgery.  NG tube will be placed.  Dr. Lady Gary will plan on admission.  ____________________________________________   FINAL CLINICAL IMPRESSION(S) / ED DIAGNOSES  Final diagnoses:  Hernia of abdominal wall  Intestinal obstruction, unspecified cause, unspecified whether  partial or complete [facility])     Note: This dictation was prepared with Dragon dictation. Any transcriptional errors that result from this process are unintentional     Phineas Semen, MD 07/13/19 0008

## 2019-07-12 NOTE — H&P (Signed)
Reason for Consult:SBO Referring Physician: Gilberto Better, MD, Emergency Medicine  Holly Simon is an 59 y.o. female.  HPI: She is known to our service from a prior admission in 2019 for partial SBO, which resolved without surgical intervention.  She is morbidly obese, with a BMI >50 and a history of prior remote abdominal hysterectomy and ventral hernia repair (around 2001) in [place].  In 2019, CT imaging demonstrated recurrence of her hernia, however she was managed with NGT decompression and her symptoms resolved.  She presented to the ED today with 2 days of abdominal pain, N/V.  CT showed partial bowel obstruction with potential incarceration within the hernia.  General Surgery has been consulted for further evaluation and management.  Past Medical History:  Diagnosis Date  . Bowel obstruction ([place])   . Diabetes mellitus without complication ([place])   . Hypertension     Past Surgical History:  Procedure Laterality Date  . VENTRAL HERNIA REPAIR  2001    No family history on file.  Social History:  reports that she has never smoked. She has never used smokeless tobacco. She reports previous alcohol use. She reports that she does not use drugs.  Allergies:  Allergies  Allergen Reactions  . Peanut-Containing Drug Products Nausea And Vomiting and Other (See Comments)    Bloating, Abdominal Pain.    Medications: I have reviewed the patient's current medications.  Results for orders placed or performed during the hospital encounter of 07/12/19 (from the past 48 hour(s))  Lipase, blood     Status: None   Collection Time: 07/12/19 11:54 AM  Result Value Ref Range   Lipase 28 11 - 51 U/L    Comment: Performed at [facility], [address] [place], [place] [address]  Comprehensive metabolic panel     Status: Abnormal   Collection Time: 07/12/19 11:54 AM  Result Value Ref Range   Sodium 133 (L) 135 - 145 mmol/L   Potassium 4.1 3.5 - 5.1 mmol/L   Chloride 100 98 - 111  mmol/L   CO2 22 22 - 32 mmol/L   Glucose, Bld 218 (H) 70 - 99 mg/dL    Comment: Glucose reference range applies only to samples taken after fasting for at least 8 hours.   BUN 20 8 - 23 mg/dL   Creatinine, Ser 0.58 0.44 - 1.00 mg/dL   Calcium 9.1 8.9 - 10.3 mg/dL   Total Protein 7.5 6.5 - 8.1 g/dL   Albumin 4.1 3.5 - 5.0 g/dL   AST 17 15 - 41 U/L   ALT 13 0 - 44 U/L   Alkaline Phosphatase 60 38 - 126 U/L   Total Bilirubin 0.8 0.3 - 1.2 mg/dL   GFR calc non Af Amer >60 >60 mL/min   GFR calc Af Amer >60 >60 mL/min   Anion gap 11 5 - 15    Comment: Performed at [facility], [address] [place], [place] [address]  CBC     Status: Abnormal   Collection Time: 07/12/19 11:54 AM  Result Value Ref Range   WBC 13.4 (H) 4.0 - 10.5 K/uL   RBC 4.21 3.87 - 5.11 MIL/uL   Hemoglobin 13.5 12.0 - 15.0 g/dL   HCT 38.4 36 - 46 %   MCV 91.2 80.0 - 100.0 fL   MCH 32.1 26.0 - 34.0 pg   MCHC 35.2 30.0 - 36.0 g/dL   RDW 12.6 11.5 - 15.5 %   Platelets 223 150 - 400 K/uL  nRBC 0.0 0.0 - 0.2 %    Comment: Performed at [facility], [address] [place], [place] [address]    CT ABDOMEN PELVIS W CONTRAST  Result Date: 07/12/2019 CLINICAL DATA:  Generalized abdominal pain for 2 days EXAM: CT ABDOMEN AND PELVIS WITH CONTRAST TECHNIQUE: Multidetector CT imaging of the abdomen and pelvis was performed using the standard protocol following bolus administration of intravenous contrast. CONTRAST:  OMNIPAQUE IOHEXOL 300 MG/ML  SOLN COMPARISON:  05/15/2017 FINDINGS: Lower chest: Lung bases are free of acute infiltrate or sizable effusion. Hepatobiliary: Liver is within normal limits. Gallbladder is well distended with multiple dependent gallstones. No biliary ductal dilatation is seen. Pancreas: Unremarkable. No pancreatic ductal dilatation or surrounding inflammatory changes. Spleen: Normal in size without focal abnormality. Adrenals/Urinary Tract: Adrenal glands are unremarkable.  Kidneys demonstrate a normal enhancement pattern bilaterally. No renal calculi or urinary tract obstructive changes are seen. Normal excretion of contrast is noted bilaterally. Bladder is decompressed. Stomach/Bowel: Colon is predominately decompressed. The appendix is within normal limits. There are multiple dilated loops of small bowel identified consistent with small bowel obstruction secondary to a large anterior abdominal wall hernia. The loops within the hernia are dilated as well suggesting a degree of incarceration. Surrounding inflammatory changes are noted adjacent to the hernia. This changes are similar to that seen on prior CT examination consistent with recurrent obstruction. Vascular/Lymphatic: Aortic atherosclerosis. No enlarged abdominal or pelvic lymph nodes. Reproductive: Uterus and bilateral adnexa are unremarkable. Other: Free fluid is noted within the abdomen and pelvis of a mild degree likely reactive to the small-bowel obstruction. No free air is seen. Large anterior abdominal wall hernia is noted similar to that seen on prior exam. Musculoskeletal: Degenerative changes of the lumbar spine are noted. IMPRESSION: Large anterior abdominal wall hernia similar in size to that noted on prior exam with evidence of proximal small-bowel obstruction and mild obstructive changes of the loops within the hernia consistent with incarceration. Surgical consultation is recommended. Mild free fluid is noted within the abdomen and pelvis reactive to the obstructive change. No perforation is noted. Cholelithiasis without complicating factors. Electronically Signed   By: Alcide Clever M.D.   On: 07/12/2019 19:55    Review of Systems  Gastrointestinal: Positive for abdominal pain, nausea and vomiting.   Today's Vitals   07/12/19 2231 07/12/19 2240 07/13/19 0431 07/13/19 1151  BP: (!) 145/62  (!) 146/70 133/66  Pulse: 79  86 72  Resp: 16  16 14   Temp: 98.7 F (37.1 C)  98.7 F (37.1 C) 98.7 F (37.1  C)  TempSrc: Oral  Oral Oral  SpO2: 99%  96% 100%  Weight: 132.6 kg     Height: 5\' 3"  (1.6 m)     PainSc:  0-No pain     Body mass index is 51.78 kg/m.  Physical Exam  Constitutional: She is oriented to person, place, and time. No distress.  HENT:  Head: Normocephalic and atraumatic.  Eyes: No scleral icterus.  Cardiovascular: Normal rate and regular rhythm.  Respiratory: Effort normal. No respiratory distress.  GI: Soft. A surgical scar is present. There is abdominal tenderness in the periumbilical area. There is no rebound and no guarding. A hernia is present. Hernia confirmed positive in the ventral area.  Genitourinary:    Genitourinary Comments: Deferred    Neurological: She is oriented to person, place, and time.  Skin: Skin is warm and dry.  Psychiatric: Her behavior is normal. Mood normal.    Assessment/Plan: -  Admit to Surgery -NPO w/IVF -NGT to wall suction  Duanne Guess 07/12/2019, 9:07 PM

## 2019-07-12 NOTE — ED Notes (Signed)
Report given to Shannon RN

## 2019-07-12 NOTE — ED Triage Notes (Signed)
Says abdominal pain started yesterday, crampy but sharp. Has had vomiting.  She is moving bowels.  Says she was upstairs taking care of her mother who is pt here and came down from there.

## 2019-07-13 LAB — CBC
HCT: 35.7 % — ABNORMAL LOW (ref 36.0–46.0)
Hemoglobin: 11.9 g/dL — ABNORMAL LOW (ref 12.0–15.0)
MCH: 31.5 pg (ref 26.0–34.0)
MCHC: 33.3 g/dL (ref 30.0–36.0)
MCV: 94.4 fL (ref 80.0–100.0)
Platelets: 202 10*3/uL (ref 150–400)
RBC: 3.78 MIL/uL — ABNORMAL LOW (ref 3.87–5.11)
RDW: 12.7 % (ref 11.5–15.5)
WBC: 3.6 10*3/uL — ABNORMAL LOW (ref 4.0–10.5)
nRBC: 0 % (ref 0.0–0.2)

## 2019-07-13 LAB — MAGNESIUM: Magnesium: 1.8 mg/dL (ref 1.7–2.4)

## 2019-07-13 LAB — BASIC METABOLIC PANEL
Anion gap: 9 (ref 5–15)
BUN: 17 mg/dL (ref 8–23)
CO2: 25 mmol/L (ref 22–32)
Calcium: 8.5 mg/dL — ABNORMAL LOW (ref 8.9–10.3)
Chloride: 102 mmol/L (ref 98–111)
Creatinine, Ser: 0.66 mg/dL (ref 0.44–1.00)
GFR calc Af Amer: >60 mL/min (ref >60)
GFR calc non Af Amer: >60 mL/min (ref >60)
Glucose, Bld: 166 mg/dL — ABNORMAL HIGH (ref 70–99)
Potassium: 3.7 mmol/L (ref 3.5–5.1)
Sodium: 136 mmol/L (ref 135–145)

## 2019-07-13 LAB — HIV ANTIBODY (ROUTINE TESTING W REFLEX): HIV Screen 4th Generation wRfx: NONREACTIVE

## 2019-07-13 LAB — GLUCOSE, CAPILLARY
Glucose-Capillary: 150 mg/dL — ABNORMAL HIGH (ref 70–99)
Glucose-Capillary: 145 mg/dL — ABNORMAL HIGH (ref 70–99)
Glucose-Capillary: 136 mg/dL — ABNORMAL HIGH (ref 70–99)
Glucose-Capillary: 127 mg/dL — ABNORMAL HIGH (ref 70–99)
Glucose-Capillary: 99 mg/dL (ref 70–99)

## 2019-07-13 LAB — HEMOGLOBIN A1C
Hgb A1c MFr Bld: 7.2 % — ABNORMAL HIGH (ref 4.8–5.6)
Mean Plasma Glucose: 159.94 mg/dL

## 2019-07-13 LAB — PHOSPHORUS: Phosphorus: 2.8 mg/dL (ref 2.5–4.6)

## 2019-07-13 MED ORDER — PANTOPRAZOLE SODIUM 40 MG IV SOLR
40.0000 mg | Freq: Two times a day (BID) | INTRAVENOUS | Status: DC
  Administered 2019-07-13 – 2019-07-17 (×10): 40 mg via INTRAVENOUS
  Filled 2019-07-13 (×10): qty 40

## 2019-07-13 MED ORDER — PANTOPRAZOLE SODIUM 40 MG IV SOLR: 40.0000 mg | INTRAVENOUS | Status: DC

## 2019-07-14 ENCOUNTER — Inpatient Hospital Stay: Payer: BC Managed Care – PPO

## 2019-07-14 DIAGNOSIS — K56609 Unspecified intestinal obstruction, unspecified as to partial versus complete obstruction: Secondary | ICD-10-CM

## 2019-07-14 LAB — GLUCOSE, CAPILLARY
Glucose-Capillary: 100 mg/dL — ABNORMAL HIGH (ref 70–99)
Glucose-Capillary: 116 mg/dL — ABNORMAL HIGH (ref 70–99)
Glucose-Capillary: 123 mg/dL — ABNORMAL HIGH (ref 70–99)
Glucose-Capillary: 79 mg/dL (ref 70–99)
Glucose-Capillary: 89 mg/dL (ref 70–99)
Glucose-Capillary: 89 mg/dL (ref 70–99)

## 2019-07-14 LAB — CBC
HCT: 32.2 % — ABNORMAL LOW (ref 36.0–46.0)
Hemoglobin: 10.9 g/dL — ABNORMAL LOW (ref 12.0–15.0)
MCH: 31.8 pg (ref 26.0–34.0)
MCHC: 33.9 g/dL (ref 30.0–36.0)
MCV: 93.9 fL (ref 80.0–100.0)
Platelets: 173 10*3/uL (ref 150–400)
RBC: 3.43 MIL/uL — ABNORMAL LOW (ref 3.87–5.11)
RDW: 12.8 % (ref 11.5–15.5)
WBC: 5.2 10*3/uL (ref 4.0–10.5)
nRBC: 0 % (ref 0.0–0.2)

## 2019-07-14 LAB — BASIC METABOLIC PANEL
Anion gap: 8 (ref 5–15)
BUN: 16 mg/dL (ref 8–23)
CO2: 27 mmol/L (ref 22–32)
Calcium: 8.1 mg/dL — ABNORMAL LOW (ref 8.9–10.3)
Chloride: 105 mmol/L (ref 98–111)
Creatinine, Ser: 0.62 mg/dL (ref 0.44–1.00)
GFR calc Af Amer: >60 mL/min (ref >60)
GFR calc non Af Amer: >60 mL/min (ref >60)
Glucose, Bld: 121 mg/dL — ABNORMAL HIGH (ref 70–99)
Potassium: 3.1 mmol/L — ABNORMAL LOW (ref 3.5–5.1)
Sodium: 140 mmol/L (ref 135–145)

## 2019-07-14 LAB — MAGNESIUM: Magnesium: 1.8 mg/dL (ref 1.7–2.4)

## 2019-07-14 LAB — PHOSPHORUS: Phosphorus: 2 mg/dL — ABNORMAL LOW (ref 2.5–4.6)

## 2019-07-14 NOTE — Progress Notes (Signed)
Subjective/Chief Complaint: Overnight, she reports having passed a small amount of flatus. She says that she has been walking and feels some intestinal movement. She denies any pain. No fevers or chills. No nausea or vomiting. She continues to have modest NG output, of 900 cc overnight.   Objective: Vital signs in last 24 hours: Temp:  [98.5 F (36.9 C)-99.1 F (37.3 C)] 99.1 F (37.3 C) (06/13 0525) Pulse Rate:  [72-80] 78 (06/13 0525) Resp:  [14-18] 18 (06/13 0525) BP: (118-133)/(55-66) 131/59 (06/13 0525) SpO2:  [98 %-100 %] 99 % (06/13 0525) Last BM Date: 07/12/19  Intake/Output from previous day: 06/12 0701 - 06/13 0700 In: 1995.6 [I.V.:1995.6] Out: 1600 [Urine:700; Emesis/NG output:900] Intake/Output this shift: No intake/output data recorded.  General: She is alert and oriented x3. No acute distress Cardiovascular: Well perfused, regular rate and rhythm Respiratory: Normal work of breathing on room air Abdomen: Less tender than yesterday, soft   Lab Results:  Recent Labs    07/13/19 0452 07/14/19 0626  WBC 3.6* 5.2  HGB 11.9* 10.9*  HCT 35.7* 32.2*  PLT 202 173   BMET Recent Labs    07/13/19 0452 07/14/19 0626  NA 136 140  K 3.7 3.1*  CL 102 105  CO2 25 27  GLUCOSE 166* 121*  BUN 17 16  CREATININE 0.66 0.62  CALCIUM 8.5* 8.1*   PT/INR No results for input(s): LABPROT, INR in the last 72 hours. ABG No results for input(s): PHART, HCO3 in the last 72 hours.  Invalid input(s): PCO2, PO2  Studies/Results: DG Abd 1 View  Result Date: 07/14/2019 CLINICAL DATA:  Small bowel obstruction EXAM: ABDOMEN - 1 VIEW COMPARISON:  July 12, 2019 CT abdomen and pelvis and abdominal radiograph FINDINGS: Nasogastric tube tip and side port in proximal stomach. There remain loops of dilated small bowel. No appreciable air-fluid levels. There is air in the colon without colonic distension. No free air appreciable on supine examination. IMPRESSION: Nasogastric tube  tip and side port in stomach. There remain loops of dilated small bowel. Question a degree of residual bowel obstruction. Air noted in colon without colonic distension. No free air evident on supine examination. Electronically Signed   By: Bretta Bang III M.D.   On: 07/14/2019 08:14   CT ABDOMEN PELVIS W CONTRAST  Result Date: 07/12/2019 CLINICAL DATA:  Generalized abdominal pain for 2 days EXAM: CT ABDOMEN AND PELVIS WITH CONTRAST TECHNIQUE: Multidetector CT imaging of the abdomen and pelvis was performed using the standard protocol following bolus administration of intravenous contrast. CONTRAST:  OMNIPAQUE IOHEXOL 300 MG/ML  SOLN COMPARISON:  05/15/2017 FINDINGS: Lower chest: Lung bases are free of acute infiltrate or sizable effusion. Hepatobiliary: Liver is within normal limits. Gallbladder is well distended with multiple dependent gallstones. No biliary ductal dilatation is seen. Pancreas: Unremarkable. No pancreatic ductal dilatation or surrounding inflammatory changes. Spleen: Normal in size without focal abnormality. Adrenals/Urinary Tract: Adrenal glands are unremarkable. Kidneys demonstrate a normal enhancement pattern bilaterally. No renal calculi or urinary tract obstructive changes are seen. Normal excretion of contrast is noted bilaterally. Bladder is decompressed. Stomach/Bowel: Colon is predominately decompressed. The appendix is within normal limits. There are multiple dilated loops of small bowel identified consistent with small bowel obstruction secondary to a large anterior abdominal wall hernia. The loops within the hernia are dilated as well suggesting a degree of incarceration. Surrounding inflammatory changes are noted adjacent to the hernia. This changes are similar to that seen on prior CT examination consistent with  recurrent obstruction. Vascular/Lymphatic: Aortic atherosclerosis. No enlarged abdominal or pelvic lymph nodes. Reproductive: Uterus and bilateral adnexa are  unremarkable. Other: Free fluid is noted within the abdomen and pelvis of a mild degree likely reactive to the small-bowel obstruction. No free air is seen. Large anterior abdominal wall hernia is noted similar to that seen on prior exam. Musculoskeletal: Degenerative changes of the lumbar spine are noted. IMPRESSION: Large anterior abdominal wall hernia similar in size to that noted on prior exam with evidence of proximal small-bowel obstruction and mild obstructive changes of the loops within the hernia consistent with incarceration. Surgical consultation is recommended. Mild free fluid is noted within the abdomen and pelvis reactive to the obstructive change. No perforation is noted. Cholelithiasis without complicating factors. Electronically Signed   By: Inez Catalina M.D.   On: 07/12/2019 19:55   DG Abd Portable 1V  Result Date: 07/12/2019 CLINICAL DATA:  Enteric tube placement EXAM: PORTABLE ABDOMEN - 1 VIEW COMPARISON:  05/16/2017 FINDINGS: The enteric tube projects over the gastric body. There are dilated loops of bowel in the upper abdomen. IMPRESSION: Enteric tube projects over the gastric body. Electronically Signed   By: Constance Holster M.D.   On: 07/12/2019 22:50    Anti-infectives: Anti-infectives (From admission, onward)   None      Assessment/Plan: This is a 59 year old female, hospital day 2 for partial small bowel obstruction. We will continue to keep her n.p.o. for now until she has better evidence of bowel function. Continue NG tube with IV fluids. Continue ambulation. No surgery at this time.  LOS: 2 days    Fredirick Maudlin 07/14/2019

## 2019-07-15 LAB — GLUCOSE, CAPILLARY
Glucose-Capillary: 105 mg/dL — ABNORMAL HIGH (ref 70–99)
Glucose-Capillary: 83 mg/dL (ref 70–99)
Glucose-Capillary: 84 mg/dL (ref 70–99)
Glucose-Capillary: 88 mg/dL (ref 70–99)
Glucose-Capillary: 92 mg/dL (ref 70–99)
Glucose-Capillary: 93 mg/dL (ref 70–99)

## 2019-07-15 MED ORDER — MENTHOL 3 MG MT LOZG
1.0000 | LOZENGE | OROMUCOSAL | Status: DC | PRN
  Filled 2019-07-15 (×2): qty 9

## 2019-07-15 NOTE — Progress Notes (Addendum)
[facility] SURGICAL PROGRESS NOTE (cpt [phone])  Hospital Day(s): 3.   Interval History: Patient seen and examined, no acute events or new complaints overnight. Patient reports she is feeling much better this morning. She reports improvement and near resolution of abdominal pain, denies fever, chills, nausea, or emesis. NGT output in the last 24 hours recorded at 800 ccs. No new labs or imaging yet this morning. She has remained NPO since admission. She was passing gas yesterday but none this morning. She is mobilizing without difficulty.    Review of Systems:  Constitutional: denies fever, chills  HEENT: denies cough or congestion  Respiratory: denies any shortness of breath  Cardiovascular: denies chest pain or palpitations  Gastrointestinal: denies abdominal pain, N/V, or diarrhea/and bowel function as per interval history Genitourinary: denies burning with urination or urinary frequency   Vital signs in last 24 hours: [min-max] current  Temp:  [98.9 F (37.2 C)-99.5 F (37.5 C)] 98.9 F (37.2 C) (06/14 0358) Pulse Rate:  [73-79] 79 (06/14 0358) Resp:  [18-19] 19 (06/14 0358) BP: (133-153)/(47-84) 147/47 (06/14 0358) SpO2:  [93 %-97 %] 93 % (06/14 0358)     Height: 5\' 3"  (160 cm) Weight: 132.6 kg BMI (Calculated): 51.8   Intake/Output last 2 shifts:  06/13 0701 - 06/14 0700 In: 1812.9 [I.V.:1812.9] Out: 800 [Emesis/NG output:800]   Physical Exam:  Constitutional: alert, cooperative and no distress  HENT: normocephalic without obvious abnormality, NGT in place  Eyes: PERRL, EOM's grossly intact and symmetric  Respiratory: breathing non-labored at rest  Cardiovascular: regular rate and sinus rhythm  Gastrointestinal: Soft, non-tender, and non-distended, no rebound.guarding Musculoskeletal: no edema or wounds, motor and sensation grossly intact, NT    Labs:  CBC Latest Ref Rng & Units 07/14/2019 07/13/2019 07/12/2019  WBC 4.0 - 10.5 K/uL 5.2 3.6(L) 13.4(H)   Hemoglobin 12.0 - 15.0 g/dL 10.9(L) 11.9(L) 13.5  Hematocrit 36 - 46 % 32.2(L) 35.7(L) 38.4  Platelets 150 - 400 K/uL 173 202 223   CMP Latest Ref Rng & Units 07/14/2019 07/13/2019 07/12/2019  Glucose 70 - 99 mg/dL 121(H) 166(H) 218(H)  BUN 8 - 23 mg/dL 16 17 20   Creatinine 0.44 - 1.00 mg/dL 0.62 0.66 0.58  Sodium 135 - 145 mmol/L 140 136 133(L)  Potassium 3.5 - 5.1 mmol/L 3.1(L) 3.7 4.1  Chloride 98 - 111 mmol/L 105 102 100  CO2 22 - 32 mmol/L 27 25 22   Calcium 8.9 - 10.3 mg/dL 8.1(L) 8.5(L) 9.1  Total Protein 6.5 - 8.1 g/dL - - 7.5  Total Bilirubin 0.3 - 1.2 mg/dL - - 0.8  Alkaline Phos 38 - 126 U/L - - 60  AST 15 - 41 U/L - - 17  ALT 0 - 44 U/L - - 13     Imaging studies: No new pertinent imaging studies   Assessment/Plan: (ICD-10's: K37.609) 59 y.o. 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  --  Lynden Oxford, PA-C [facility] 07/15/2019, 7:40 AM [phone] M-F: 7am - 4pm  I saw and evaluated the patient.  I agree with the above documentation, exam, and plan, which I have edited where appropriate. Duanne Guess  9:00 AM

## 2019-07-16 LAB — GLUCOSE, CAPILLARY
Glucose-Capillary: 103 mg/dL — ABNORMAL HIGH (ref 70–99)
Glucose-Capillary: 134 mg/dL — ABNORMAL HIGH (ref 70–99)
Glucose-Capillary: 82 mg/dL (ref 70–99)
Glucose-Capillary: 85 mg/dL (ref 70–99)
Glucose-Capillary: 90 mg/dL (ref 70–99)
Glucose-Capillary: 92 mg/dL (ref 70–99)

## 2019-07-16 MED ORDER — ENOXAPARIN SODIUM 40 MG/0.4ML ~~LOC~~ SOLN
40.0000 mg | Freq: Two times a day (BID) | SUBCUTANEOUS | Status: DC
  Administered 2019-07-16 – 2019-07-17 (×3): 40 mg via SUBCUTANEOUS
  Filled 2019-07-16 (×3): qty 0.4

## 2019-07-16 NOTE — Progress Notes (Signed)
Pharmacy Lovenox Dosing  59 y.o. female admitted with Abdominal Pain and Emesis . Patient ordered Lovenox 40 mg daily for VTE prophylaxis.   Filed Weights   07/12/19 1136 07/12/19 2231  Weight: 132.9 kg (293 lb) 132.6 kg (292 lb 5.3 oz)    Body mass index is 51.78 kg/m.  Estimated Creatinine Clearance: 98.5 mL/min (by C-G formula based on SCr of 0.62 mg/dL).  Will adjust Lovenox dosing to 40 mg BID as BMI>40.   Bettey Costa 07/16/2019 2:33 PM

## 2019-07-16 NOTE — Progress Notes (Addendum)
[facility] SURGICAL PROGRESS NOTE (cpt [phone])  Hospital Day(s): 4.   Interval History: Patient seen and examined, no acute events or new complaints overnight. Patient reports she is feeling much better, denies abdominal pain, nausea, emesis, fever. No new labs or imaging. NGT with 200 ccs out recorded in last 24 hours. This has been clamped overnight and had less than 10 ccs residual. She has been NPO. + Bowel function. She is mobilizing well.   Review of Systems:  Constitutional: denies fever, chills  HEENT: denies cough or congestion  Respiratory: denies any shortness of breath  Cardiovascular: denies chest pain or palpitations  Gastrointestinal: denies abdominal pain, N/V, or diarrhea/and bowel function as per interval history Genitourinary: denies burning with urination or urinary frequency   Vital signs in last 24 hours: [min-max] current  Temp:  [98.3 F (36.8 C)-98.6 F (37 C)] 98.5 F (36.9 C) (06/15 0439) Pulse Rate:  [72-82] 75 (06/15 0439) Resp:  [18-19] 18 (06/15 0439) BP: (123-155)/(47-73) 151/67 (06/15 0439) SpO2:  [98 %] 98 % (06/15 0439)     Height: 5\' 3"  (160 cm) Weight: 132.6 kg BMI (Calculated): 51.8   Intake/Output last 2 shifts:  06/14 0701 - 06/15 0700 In: 2870.1 [I.V.:2870.1] Out: 500 [Urine:300; Emesis/NG output:200]   Physical Exam:  Constitutional: alert, cooperative and no distress  HENT: normocephalic without obvious abnormality, NGT in place  Eyes: PERRL, EOM's grossly intact and symmetric  Respiratory: breathing non-labored at rest  Cardiovascular: regular rate and sinus rhythm  Gastrointestinal: Soft, non-tender, and non-distended, no rebound.guarding Musculoskeletal: no edema or wounds, motor and sensation grossly intact, NT    Labs:  CBC Latest Ref Rng & Units 07/14/2019 07/13/2019 07/12/2019  WBC 4.0 - 10.5 K/uL 5.2 3.6(L) 13.4(H)  Hemoglobin 12.0 - 15.0 g/dL 10.9(L) 11.9(L) 13.5  Hematocrit 36 - 46 % 32.2(L) 35.7(L) 38.4   Platelets 150 - 400 K/uL 173 202 223   CMP Latest Ref Rng & Units 07/14/2019 07/13/2019 07/12/2019  Glucose 70 - 99 mg/dL 09/11/2019) 382(N) 053(Z)  BUN 8 - 23 mg/dL 16 17 20   Creatinine 0.44 - 1.00 mg/dL 767(H 4.19  Sodium 135 - 145 mmol/L 140 136 133(L)  Potassium 3.5 - 5.1 mmol/L 3.1(L) 3.7 4.1  Chloride 98 - 111 mmol/L 105 102 100  CO2 22 - 32 mmol/L 27 25 22   Calcium 8.9 - 10.3 mg/dL 8.1(L) 8.5(L) 9.1  Total Protein 6.5 - 8.1 g/dL - - 7.5  Total Bilirubin 0.3 - 1.2 mg/dL - - 0.8  Alkaline Phos 38 - 126 U/L - - 60  AST 15 - 41 U/L - - 17  ALT 0 - 44 U/L - - 13     Imaging studies: No new pertinent imaging studies   Assessment/Plan: (ICD-10's: K17.609) 59 y.o. 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   - Removed NGT at bedside this morning  - Initiate CLD; ADAT   - Continue IVF resuscitation; wean as diet advances             - Pain control prn; antiemetics prn             - Monitor abdominal examination; on-going bowel function             - medical management of comorbid conditions             - mobilization encouraged              - No emergent  surgical intervention              - DVT prophylaxis    - Discharge Planning: IF tolerates advancement of diet, hopefully home in 24-48 hours  All of the above findings and recommendations were discussed with the patient, and the medical team, and all of patient's questions were answered to her expressed satisfaction.  -- [name], PA-C [facility] 07/16/2019, 7:47 AM [phone] M-F: 7am - 4pm

## 2019-07-16 NOTE — Progress Notes (Signed)
Pt c/o abdominal left lower quad pain. Pt moved bowels x 1, and passing gas x3 as well. Pt was advance to clear liquid for breakfast and lunch. At this time, will keep pt CLD due to pain. MD will be notified.

## 2019-07-16 NOTE — Progress Notes (Signed)
Pt had moved bowels x 4 today.

## 2019-07-17 LAB — GLUCOSE, CAPILLARY
Glucose-Capillary: 131 mg/dL — ABNORMAL HIGH (ref 70–99)
Glucose-Capillary: 116 mg/dL — ABNORMAL HIGH (ref 70–99)
Glucose-Capillary: 106 mg/dL — ABNORMAL HIGH (ref 70–99)
Glucose-Capillary: 114 mg/dL — ABNORMAL HIGH (ref 70–99)
Glucose-Capillary: 142 mg/dL — ABNORMAL HIGH (ref 70–99)
Glucose-Capillary: 113 mg/dL — ABNORMAL HIGH (ref 70–99)

## 2019-07-17 NOTE — Progress Notes (Addendum)
[facility] SURGICAL PROGRESS NOTE (cpt [phone])  Hospital Day(s): 5.   Interval History: Patient seen and examined, no acute events or new complaints overnight. Patient reports that she had some abdominal discomfort overnight related to gas but this resolved and currently pain free, denies fever, chills, nausea, or emesis. No new labs or imaging this morning. She was advanced to CLD yesterday and tolerated well. She continues to endorse flatus and had 4x BM yesterday, these were looser and more watery in nature. Continues to mobilize without issue.   Review of Systems:  Constitutional: denies fever, chills  HEENT: denies cough or congestion  Respiratory: denies any shortness of breath  Cardiovascular: denies chest pain or palpitations  Gastrointestinal: denies abdominal pain, N/V, or diarrhea/and bowel function as per interval history Genitourinary: denies burning with urination or urinary frequency  Vital signs in last 24 hours: [min-max] current  Temp:  [98.5 F (36.9 C)-98.9 F (37.2 C)] 98.8 F (37.1 C) (06/16 0508) Pulse Rate:  [57-71] 64 (06/16 0508) Resp:  [18-20] 18 (06/16 0508) BP: (130-152)/(51-66) 145/66 (06/16 0508) SpO2:  [96 %-98 %] 96 % (06/16 0508)     Height: 5\' 3"  (160 cm) Weight: 132.6 kg BMI (Calculated): 51.8   Intake/Output last 2 shifts:  06/15 0701 - 06/16 0700 In: 3036.2 [P.O.:820; I.V.:2216.2] Out: 600 [Urine:600]   Physical Exam:  Constitutional: alert, cooperative and no distress  HENT: normocephalic without obvious abnormality Eyes: PERRL, EOM's grossly intact and symmetric  Respiratory: breathing non-labored at rest  Cardiovascular: regular rate and sinus rhythm  Gastrointestinal: Soft, non-tender, and non-distended, no rebound.guarding Musculoskeletal: no edema or wounds, motor and sensation grossly intact, NT    Labs:  CBC Latest Ref Rng & Units 07/14/2019 07/13/2019 07/12/2019  WBC 4.0 - 10.5 K/uL 5.2 3.6(L) 13.4(H)  Hemoglobin  12.0 - 15.0 g/dL 10.9(L) 11.9(L) 13.5  Hematocrit 36 - 46 % 32.2(L) 35.7(L) 38.4  Platelets 150 - 400 K/uL 173 202 223   CMP Latest Ref Rng & Units 07/14/2019 07/13/2019 07/12/2019  Glucose 70 - 99 mg/dL 09/11/2019) 621(H) 086(V)  BUN 8 - 23 mg/dL 16 17 20   Creatinine 0.44 - 1.00 mg/dL 784(O 9.62  Sodium 135 - 145 mmol/L 140 136 133(L)  Potassium 3.5 - 5.1 mmol/L 3.1(L) 3.7 4.1  Chloride 98 - 111 mmol/L 105 102 100  CO2 22 - 32 mmol/L 27 25 22   Calcium 8.9 - 10.3 mg/dL 8.1(L) 8.5(L) 9.1  Total Protein 6.5 - 8.1 g/dL - - 7.5  Total Bilirubin 0.3 - 1.2 mg/dL - - 0.8  Alkaline Phos 38 - 126 U/L - - 60  AST 15 - 41 U/L - - 17  ALT 0 - 44 U/L - - 13    Imaging studies: No new pertinent imaging studies   Assessment/Plan: (ICD-10's: K62.609) 59 y.o. 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   - Will initiate full liquid diet; okay to advance to soft diet tonight or tomorrow morning if doing well   - Continue IVF resuscitation; wean as diet advances             - Pain control prn; antiemetics prn             - Monitor abdominal examination; on-going bowel function             - medical management of comorbid conditions             - mobilization encouraged              -  No emergent surgical intervention              - DVT prophylaxis                - Discharge Planning: If tolerates advancement of diet, hopefully home in 24-48 hours   All of the above findings and recommendations were discussed with the patient, and the medical team, and all of patient's questions were answered to her expressed satisfaction.  -- [name], PA-C [facility] 07/17/2019, 6:59 AM [phone] M-F: 7am - 4pm  I saw and evaluated the patient.  I agree with the above documentation, exam, and plan, which I have edited where appropriate. Fredirick Maudlin  1:57 PM

## 2019-07-18 LAB — GLUCOSE, CAPILLARY
Glucose-Capillary: 111 mg/dL — ABNORMAL HIGH (ref 70–99)
Glucose-Capillary: 108 mg/dL — ABNORMAL HIGH (ref 70–99)
Glucose-Capillary: 102 mg/dL — ABNORMAL HIGH (ref 70–99)

## 2019-07-18 NOTE — Plan of Care (Signed)
  Problem: Education: Goal: Knowledge of General Education information will improve Description: Including pain rating scale, medication(s)/side effects and non-pharmacologic comfort measures Outcome: Progressing   Problem: Health Behavior/Discharge Planning: Goal: Ability to manage health-related needs will improve Outcome: Progressing   Problem: Nutrition: Goal: Adequate nutrition will be maintained Outcome: Progressing   

## 2019-07-18 NOTE — Progress Notes (Signed)
Holly Simon to be D/C'd home per MD order.  Discussed prescriptions and follow up appointments with the patient. Prescriptions given to patient, medication list explained in detail. Pt verbalized understanding.  Allergies as of 07/18/2019       Reactions   Peanut-containing Drug Products Nausea And Vomiting, Other (See Comments)   Bloating, Abdominal Pain.        Medication List     TAKE these medications    glimepiride 2 MG tablet Commonly known as: AMARYL Take 2 mg by mouth 2 (two) times daily.   lisinopril 10 MG tablet Commonly known as: ZESTRIL Take 10 mg by mouth daily.   Magnesium Oxide 250 MG Tabs Take 250 mg by mouth 2 (two) times daily.   metFORMIN 1000 MG tablet Commonly known as: GLUCOPHAGE Take 1,000-1,500 mg by mouth 2 (two) times daily with a meal. Take 1000 mg in the morning, 1500 mg at supper.   pioglitazone 45 MG tablet Commonly known as: ACTOS Take 45 mg by mouth daily.   Potassium 99 MG Tabs Take 1 tablet by mouth daily.        Vitals:   07/17/19 2148 07/18/19 0446  BP: (!) 156/79 140/63  Pulse: 63 61  Resp: 18 18  Temp: 98.4 F (36.9 C) 98.2 F (36.8 C)  SpO2: 100% 95%    Skin clean, dry and intact without evidence of skin break down, no evidence of skin tears noted. IV catheter discontinued intact. Site without signs and symptoms of complications. Dressing and pressure applied. Pt denies pain at this time. No complaints noted.  An After Visit Summary was printed and given to the patient. Patient escorted via WC, and D/C home via private auto.  Holly Simon [name]

## 2019-07-18 NOTE — Discharge Summary (Addendum)
[facility] SURGICAL DISCHARGE SUMMARY (cpt: [phone])  Patient ID: Holly Simon MRN: [id number] DOB/AGE: [birth year] 61 y.o.  Admit date: 07/12/2019 Discharge date: 07/18/2019  Discharge Diagnoses Patient Active Problem List   Diagnosis Date Noted   Partial small bowel obstruction (HCC) 07/12/2019   Intestinal adhesions with complete obstruction (HCC)    Small bowel obstruction (HCC) 05/15/2017    Consultants None  Procedures None  HPI: Holly Simon is an 59 y.o. female. She is known to our service from a prior admission in 2019 for partial SBO, which resolved without surgical intervention.  She is morbidly obese, with a BMI >50 and a history of prior remote abdominal hysterectomy and ventral hernia repair (around 2001) in [place].  In 2019, CT imaging demonstrated recurrence of her hernia, however she was managed with NGT decompression and her symptoms resolved.  She presented to the ED today with 2 days of abdominal pain, N/V.  CT showed partial bowel obstruction with potential incarceration within the hernia.  General Surgery has been consulted for further evaluation and management.    Hospital Course: She was admitted to general surgery and underwent conservative management with NGT decompression. She did well and had return of bowel function fairly quickly. NGT was removed on 06/15 after passing clamping trial. Advancement of patient's diet and ambulation were well-tolerated. The remainder of patient's hospital course was essentially unremarkable, and discharge planning was initiated accordingly with patient safely able to be discharged home with appropriate discharge instructions, pain control, and outpatient follow-up after all of her questions were answered to her expressed satisfaction.   Discharge Condition: GOod   Physical Examination:  Constitutional: alert, cooperative and no distress  HENT: normocephalic without obvious abnormality Eyes: PERRL, EOM's  grossly intact and symmetric  Respiratory: breathing non-labored at rest  Cardiovascular: regular rate and sinus rhythm  Gastrointestinal: Soft, non-tender, and non-distended, no rebound.guarding Musculoskeletal: no edema or wounds, motor and sensation grossly intact, NT   Allergies as of 07/18/2019       Reactions   Peanut-containing Drug Products Nausea And Vomiting, Other (See Comments)   Bloating, Abdominal Pain.        Medication List     TAKE these medications    glimepiride 2 MG tablet Commonly known as: AMARYL Take 2 mg by mouth 2 (two) times daily.   lisinopril 10 MG tablet Commonly known as: ZESTRIL Take 10 mg by mouth daily.   Magnesium Oxide 250 MG Tabs Take 250 mg by mouth 2 (two) times daily.   metFORMIN 1000 MG tablet Commonly known as: GLUCOPHAGE Take 1,000-1,500 mg by mouth 2 (two) times daily with a meal. Take 1000 mg in the morning, 1500 mg at supper.   pioglitazone 45 MG tablet Commonly known as: ACTOS Take 45 mg by mouth daily.   Potassium 99 MG Tabs Take 1 tablet by mouth daily.          Follow-up Information     Holly Nurse, MD. Schedule an appointment as soon as possible for a visit in 3 week(s).   Specialty: Internal Medicine Why: 2-3 week hospital follow up for SBO Contact information: [address] [place] [address] [phone]         [facility] Follow up.   Specialty: General Surgery Why: She does NOT need a follow up with [place], this is to provide contat information only Contact information: [address] [place] [address] [phone]  Time spent on discharge management including discussion of hospital course, clinical condition, outpatient instructions, prescriptions, and follow up with the patient and members of the medical team: >30 minutes  -- Holly Simon , PA-C [facility]  07/18/2019, 8:47  AM [phone] M-F: 7am - 4pm'   I agree with the above documentation, exam, and plan, which I have edited where appropriate. Holly Simon  12:56 PM

## 2019-07-26 ENCOUNTER — Ambulatory Visit
Admission: RE | Admit: 2019-07-26 | Discharge: 2019-07-26 | Disposition: A | Discharge status: Home / Self Care | Payer: BC Managed Care – PPO | Source: Ambulatory Visit | Attending: Internal Medicine | Admitting: Internal Medicine

## 2019-07-26 DIAGNOSIS — Z1231 Encounter for screening mammogram for malignant neoplasm of breast: Secondary | ICD-10-CM | POA: Insufficient documentation

## 2019-08-01 ENCOUNTER — Other Ambulatory Visit: Payer: Self-pay | Admitting: Internal Medicine

## 2019-08-01 DIAGNOSIS — R928 Other abnormal and inconclusive findings on diagnostic imaging of breast: Secondary | ICD-10-CM

## 2019-08-01 DIAGNOSIS — R921 Mammographic calcification found on diagnostic imaging of breast: Secondary | ICD-10-CM

## 2019-08-08 ENCOUNTER — Ambulatory Visit
Admission: RE | Admit: 2019-08-08 | Discharge: 2019-08-08 | Disposition: A | Discharge status: Home / Self Care | Payer: BC Managed Care – PPO | Source: Ambulatory Visit | Attending: Internal Medicine | Admitting: Internal Medicine

## 2019-08-08 DIAGNOSIS — R921 Mammographic calcification found on diagnostic imaging of breast: Secondary | ICD-10-CM

## 2019-08-08 DIAGNOSIS — R928 Other abnormal and inconclusive findings on diagnostic imaging of breast: Secondary | ICD-10-CM

## 2019-08-09 ENCOUNTER — Other Ambulatory Visit: Payer: Self-pay | Admitting: Internal Medicine

## 2019-08-09 DIAGNOSIS — R921 Mammographic calcification found on diagnostic imaging of breast: Secondary | ICD-10-CM

## 2020-02-24 ENCOUNTER — Other Ambulatory Visit: Payer: Self-pay

## 2020-02-24 ENCOUNTER — Ambulatory Visit
Admission: RE | Admit: 2020-02-24 | Discharge: 2020-02-24 | Disposition: A | Discharge status: Home / Self Care | Payer: BC Managed Care – PPO | Source: Ambulatory Visit | Attending: Internal Medicine | Admitting: Internal Medicine

## 2020-02-24 DIAGNOSIS — R921 Mammographic calcification found on diagnostic imaging of breast: Secondary | ICD-10-CM | POA: Insufficient documentation

## 2020-02-26 ENCOUNTER — Other Ambulatory Visit: Payer: Self-pay | Admitting: Internal Medicine

## 2020-02-26 DIAGNOSIS — R921 Mammographic calcification found on diagnostic imaging of breast: Secondary | ICD-10-CM

## 2020-04-06 ENCOUNTER — Ambulatory Visit: Payer: BC Managed Care – PPO | Admitting: Dermatology

## 2020-04-06 ENCOUNTER — Other Ambulatory Visit: Payer: Self-pay

## 2020-04-06 DIAGNOSIS — L408 Other psoriasis: Secondary | ICD-10-CM

## 2020-04-06 DIAGNOSIS — L404 Guttate psoriasis: Secondary | ICD-10-CM

## 2020-04-06 DIAGNOSIS — L409 Psoriasis, unspecified: Secondary | ICD-10-CM

## 2020-04-06 DIAGNOSIS — L719 Rosacea, unspecified: Secondary | ICD-10-CM

## 2020-04-06 DIAGNOSIS — I872 Venous insufficiency (chronic) (peripheral): Secondary | ICD-10-CM

## 2020-04-06 DIAGNOSIS — L400 Psoriasis vulgaris: Secondary | ICD-10-CM | POA: Diagnosis not present

## 2020-04-06 MED ORDER — ENSTILAR 0.005-0.064 % EX FOAM: CUTANEOUS | 3 refills | Status: AC

## 2020-04-06 MED ORDER — HYDROCORTISONE 2.5 % EX LOTN: TOPICAL_LOTION | CUTANEOUS | 3 refills | Status: AC

## 2020-04-06 NOTE — Progress Notes (Signed)
   New Patient Visit  Subjective  TASMINE HIPWELL is a 60 y.o. female who presents for the following: Rash (Patient c/o scaling on the nose, dryness under the eyes, nose, and forehead and a similar spot on the L lower leg. She is currently using Halocinonide on the face PRN. Rash has came and went for years and other members in her family have a similar rash).  The following portions of the chart were reviewed this encounter and updated as appropriate:   Tobacco  Allergies  Meds  Problems  Med Hx  Surg Hx  Fam Hx     Review of Systems:  No other skin or systemic complaints except as noted in HPI or Assessment and Plan.  Objective  Well appearing patient in no apparent distress; mood and affect are within normal limits.  A focused examination was performed including the extremities. Relevant physical exam findings are noted in the Assessment and Plan.  Objective  Trunk, extremities: Guttate plaques  Objective  B/L leg: Erythematous, scaly patches involving the ankle and distal lower leg with associated lower leg edema.   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 It is associated with an increased risk of inflammatory bowel disease, heart disease, non-alcoholic fatty liver disease, and depression.    Recommend referral to rheumatologist due to joint pain to r/o RA vs PsA vs OA vs other   Start Enstilar foam to aa's BID x 2 weeks then decrease use to QD PRN. If not covered recommend Taclonex.   Ok to continue Halog 0.1% cream to aa's QD PRN.   Start HC 2.5% lotion to aa's face QD on M, W, and F PRN.   hydrocortisone 2.5 % lotion -  Trunk, extremities  Calcipotriene-Betameth Diprop (ENSTILAR) 0.005-0.064 % FOAM - Trunk, extremities  Ambulatory referral to Rheumatology - Trunk, extremities  Stasis dermatitis of both legs B/L leg Recommend graduate compression stockings (knee high) daily. [place] pamphlet given.  Rosacea Face Has used Metronidazole in the past which did help with papules, but she hasn't had a flare in a long time. Do not recommend treatment at this time.  Rosacea is a chronic progressive skin condition usually affecting the face of adults, causing redness and/or acne bumps. It is treatable but not curable. It sometimes affects the eyes (ocular rosacea) as well. It may respond to topical and/or systemic medication and can flare with stress, sun exposure, alcohol, exercise and some foods.  Daily application of broad spectrum spf 30+ sunscreen to face is recommended to reduce flares.  Return in about 6 months (around 10/07/2020) for psoriasis follow up .  Maylene Roes, CMA, am acting as scribe for Armida Sans, MD .  Documentation: I have reviewed the above documentation for accuracy and completeness, and I agree with the above.  Armida Sans, MD

## 2020-04-07 ENCOUNTER — Encounter: Payer: Self-pay | Admitting: Dermatology

## 2020-08-25 ENCOUNTER — Ambulatory Visit
Admission: RE | Admit: 2020-08-25 | Discharge: 2020-08-25 | Disposition: A | Discharge status: Home / Self Care | Payer: BC Managed Care – PPO | Source: Ambulatory Visit | Attending: Internal Medicine | Admitting: Internal Medicine

## 2020-08-25 ENCOUNTER — Other Ambulatory Visit: Payer: Self-pay

## 2020-08-25 DIAGNOSIS — R921 Mammographic calcification found on diagnostic imaging of breast: Secondary | ICD-10-CM | POA: Diagnosis not present

## 2020-09-09 ENCOUNTER — Telehealth: Payer: Self-pay

## 2020-09-09 NOTE — Telephone Encounter (Signed)
Left message on voicemail to return my call. [facility] has tried to contact patient three times to schedule a new patient appointment, and have not received a return phone call.

## 2020-10-14 ENCOUNTER — Ambulatory Visit: Payer: BC Managed Care – PPO | Admitting: Dermatology

## 2022-04-26 ENCOUNTER — Other Ambulatory Visit: Payer: Self-pay | Admitting: Internal Medicine

## 2022-04-26 DIAGNOSIS — Z1231 Encounter for screening mammogram for malignant neoplasm of breast: Secondary | ICD-10-CM

## 2022-06-13 ENCOUNTER — Encounter: Payer: Self-pay | Admitting: Internal Medicine

## 2022-06-17 ENCOUNTER — Other Ambulatory Visit: Payer: Self-pay | Admitting: Internal Medicine

## 2022-06-17 DIAGNOSIS — R921 Mammographic calcification found on diagnostic imaging of breast: Secondary | ICD-10-CM

## 2022-07-06 ENCOUNTER — Ambulatory Visit
Admission: RE | Admit: 2022-07-06 | Discharge: 2022-07-06 | Disposition: A | Discharge status: Home / Self Care | Payer: BC Managed Care – PPO | Source: Ambulatory Visit | Attending: Internal Medicine | Admitting: Internal Medicine

## 2022-07-06 DIAGNOSIS — R921 Mammographic calcification found on diagnostic imaging of breast: Secondary | ICD-10-CM | POA: Diagnosis present

## 2022-07-24 IMAGING — MG MM DIGITAL DIAGNOSTIC UNILAT*L* W/ TOMO W/ CAD
8 of 10 series · 8 of 22 positions shown · non-contrast
Comparison: Previous exam(s).

CLINICAL DATA: 59-year-old female presenting for first six-month
follow-up of probably benign left breast calcifications.

EXAM:
DIGITAL DIAGNOSTIC UNILATERAL LEFT MAMMOGRAM WITH TOMO AND CAD
TECHNIQUE: Left digital diagnostic mammography and breast tomosynthesis was
performed. Digital images of the breasts were evaluated with
computer-aided detection.

[L CC (1 of 2)]
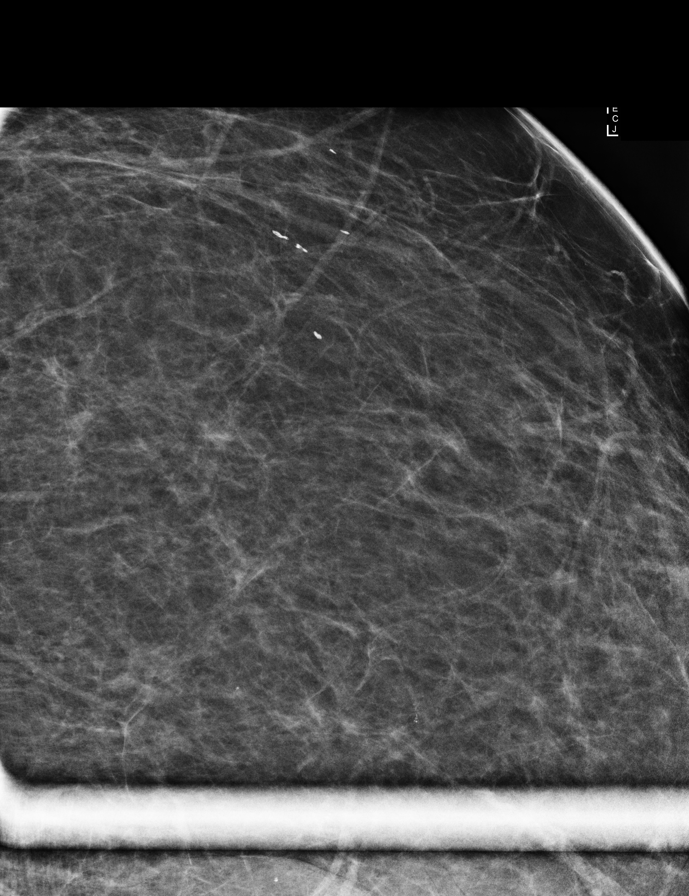

[L CC (2 of 2)]
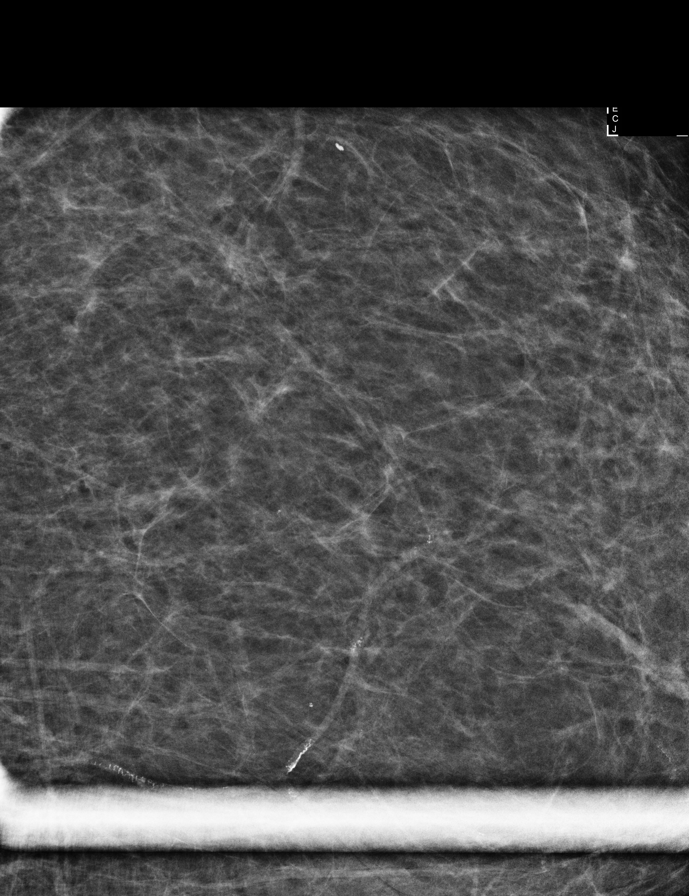

[L ML (1 of 2)]
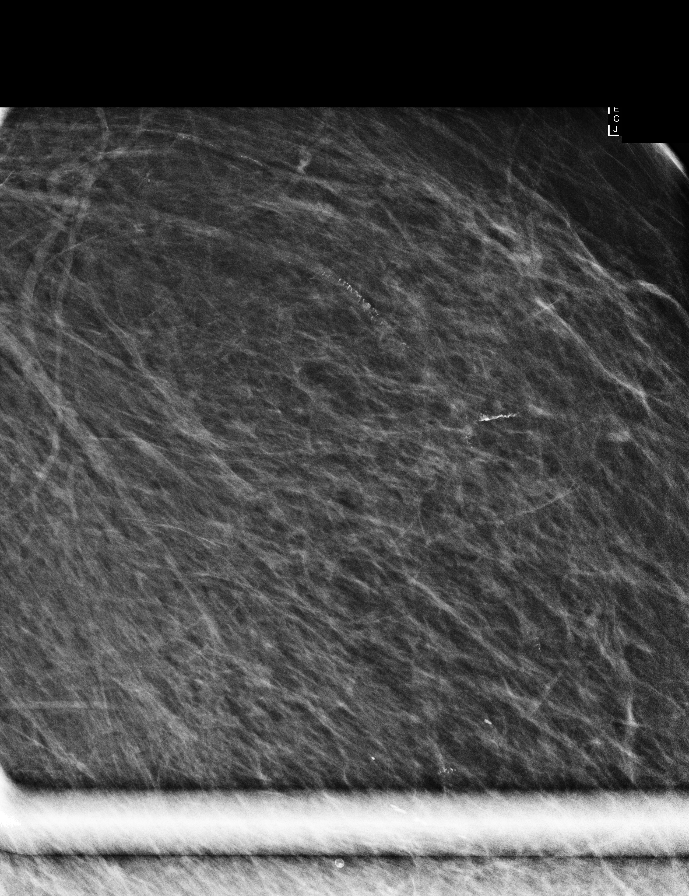

[L ML (2 of 2)]
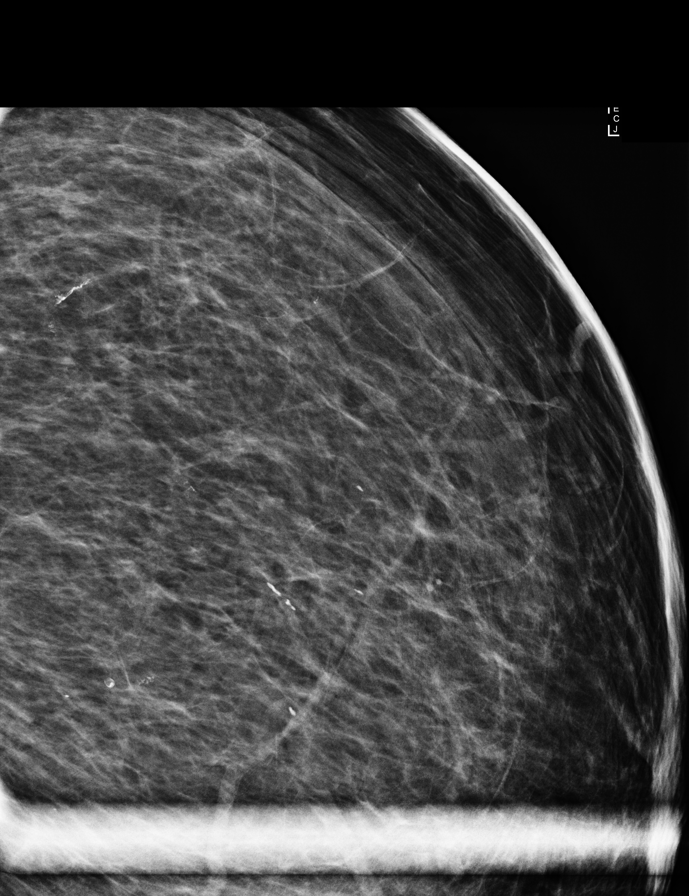

[L MLO synth-2D (1 of 2)]
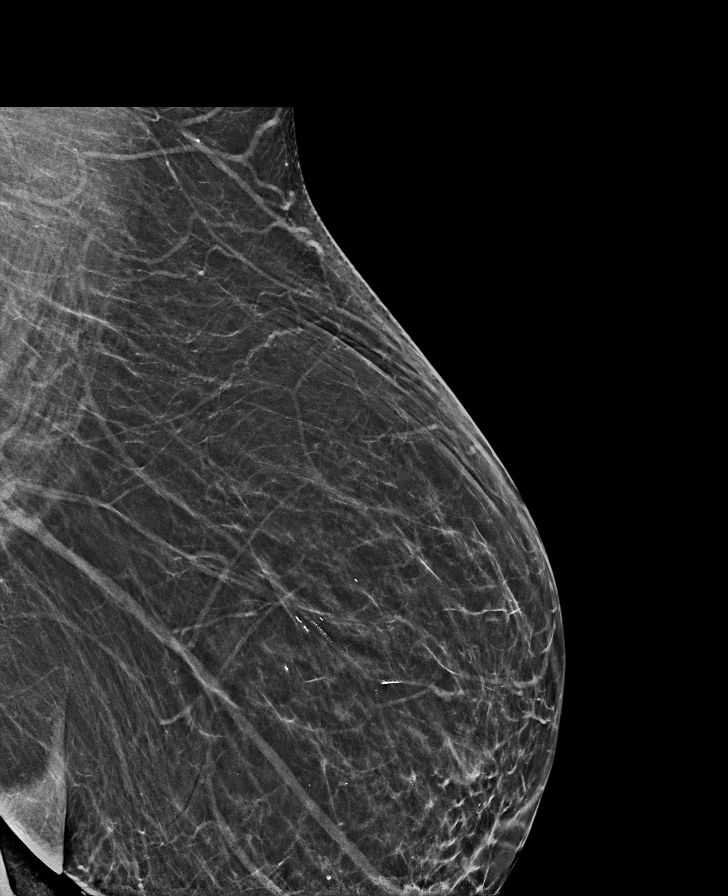

[L CC synth-2D]
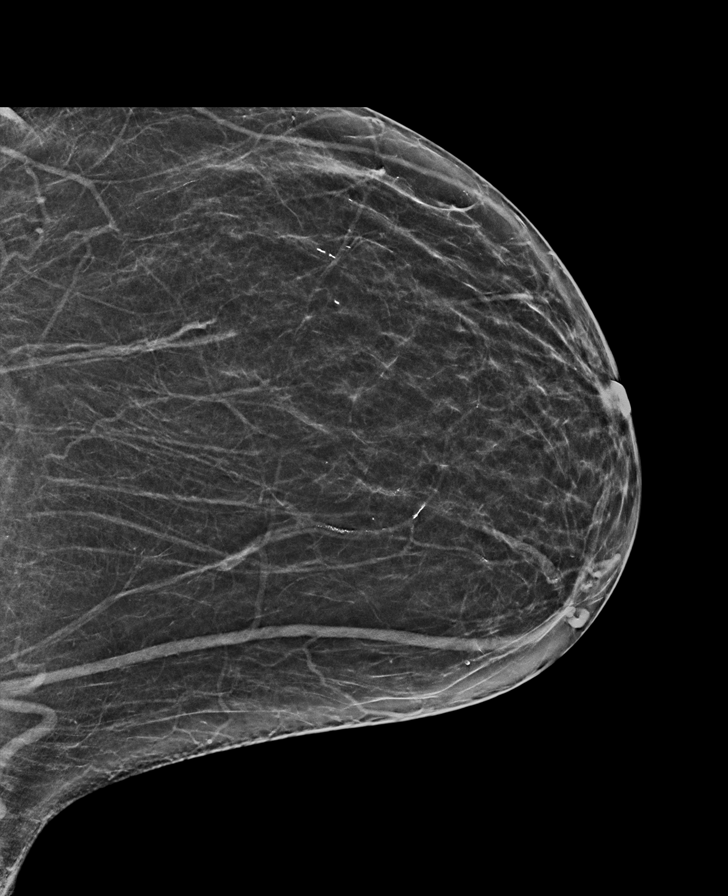

[L MLO synth-2D (2 of 2)]
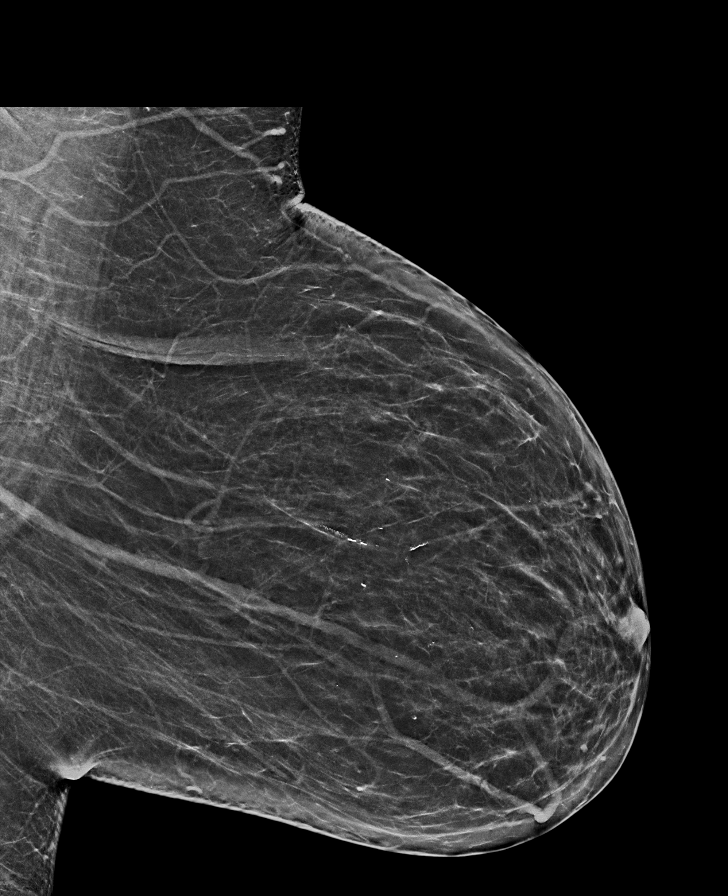

[L MLO tomo · tomo slice 34/67.0]
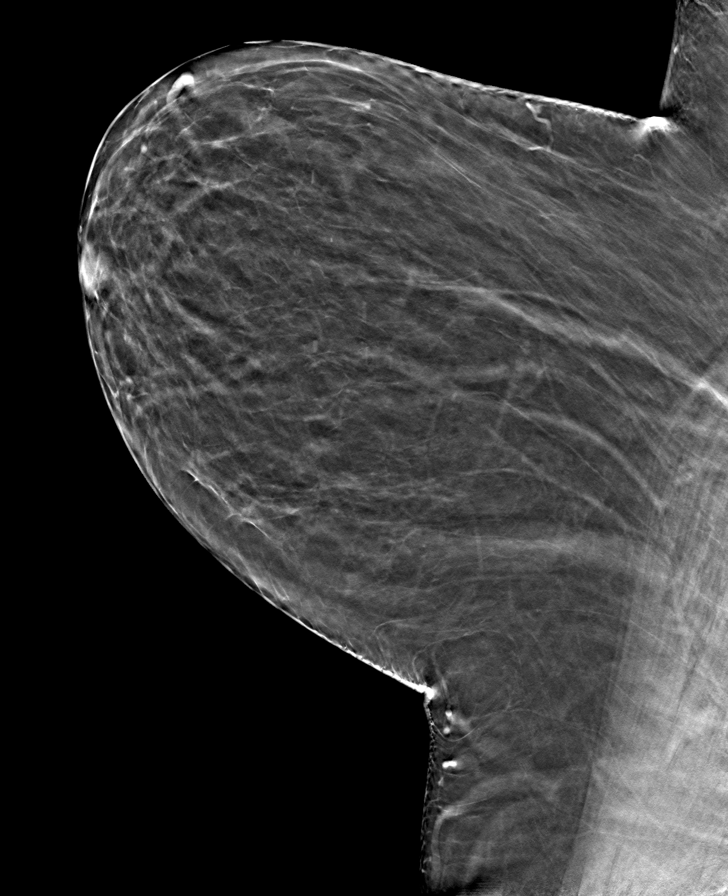

[8 of 22 positions shown; findings below may reference images not displayed]

ACR Breast Density Category b: There are scattered areas of
fibroglandular density.
FINDINGS: 2-3 mm grouped calcifications in the central left breast are
mammographically stable. No new or suspicious findings in the
remainder of the left breast.
IMPRESSION: Stable, probably benign left breast calcifications. Recommend
continued imaging follow-up.

RECOMMENDATION:
Bilateral diagnostic mammogram in 6 months.

I have discussed the findings and recommendations with the patient.
If applicable, a reminder letter will be sent to the patient
regarding the next appointment.

BI-RADS CATEGORY  3: Probably benign.

## 2022-10-24 ENCOUNTER — Encounter: Payer: Self-pay | Admitting: *Deleted

## 2022-10-24 ENCOUNTER — Other Ambulatory Visit: Payer: Self-pay

## 2022-10-24 DIAGNOSIS — Z8719 Personal history of other diseases of the digestive system: Secondary | ICD-10-CM

## 2022-10-24 DIAGNOSIS — Z7985 Long-term (current) use of injectable non-insulin antidiabetic drugs: Secondary | ICD-10-CM

## 2022-10-24 DIAGNOSIS — Z79899 Other long term (current) drug therapy: Secondary | ICD-10-CM

## 2022-10-24 DIAGNOSIS — K56609 Unspecified intestinal obstruction, unspecified as to partial versus complete obstruction: Secondary | ICD-10-CM | POA: Diagnosis not present

## 2022-10-24 DIAGNOSIS — K660 Peritoneal adhesions (postprocedural) (postinfection): Secondary | ICD-10-CM | POA: Diagnosis present

## 2022-10-24 DIAGNOSIS — Z6841 Body Mass Index (BMI) 40.0 and over, adult: Secondary | ICD-10-CM

## 2022-10-24 DIAGNOSIS — Z9889 Other specified postprocedural states: Secondary | ICD-10-CM

## 2022-10-24 DIAGNOSIS — L409 Psoriasis, unspecified: Secondary | ICD-10-CM | POA: Diagnosis present

## 2022-10-24 DIAGNOSIS — D72829 Elevated white blood cell count, unspecified: Secondary | ICD-10-CM | POA: Diagnosis present

## 2022-10-24 DIAGNOSIS — Z888 Allergy status to other drugs, medicaments and biological substances status: Secondary | ICD-10-CM

## 2022-10-24 DIAGNOSIS — Z7984 Long term (current) use of oral hypoglycemic drugs: Secondary | ICD-10-CM

## 2022-10-24 DIAGNOSIS — K430 Incisional hernia with obstruction, without gangrene: Principal | ICD-10-CM | POA: Diagnosis present

## 2022-10-24 DIAGNOSIS — H532 Diplopia: Secondary | ICD-10-CM | POA: Diagnosis not present

## 2022-10-24 DIAGNOSIS — R9431 Abnormal electrocardiogram [ECG] [EKG]: Secondary | ICD-10-CM | POA: Diagnosis present

## 2022-10-24 DIAGNOSIS — Z5331 Laparoscopic surgical procedure converted to open procedure: Secondary | ICD-10-CM

## 2022-10-24 DIAGNOSIS — I10 Essential (primary) hypertension: Secondary | ICD-10-CM | POA: Diagnosis present

## 2022-10-24 DIAGNOSIS — Z91010 Allergy to peanuts: Secondary | ICD-10-CM

## 2022-10-24 DIAGNOSIS — E876 Hypokalemia: Secondary | ICD-10-CM | POA: Diagnosis not present

## 2022-10-24 DIAGNOSIS — R059 Cough, unspecified: Secondary | ICD-10-CM | POA: Diagnosis not present

## 2022-10-24 DIAGNOSIS — E1165 Type 2 diabetes mellitus with hyperglycemia: Secondary | ICD-10-CM | POA: Diagnosis present

## 2022-10-24 LAB — CBC
HCT: 41.7 % (ref 36.0–46.0)
Hemoglobin: 12.9 g/dL (ref 12.0–15.0)
MCH: 31.5 pg (ref 26.0–34.0)
MCHC: 30.9 g/dL (ref 30.0–36.0)
MCV: 102 fL — ABNORMAL HIGH (ref 80.0–100.0)
Platelets: 196 10*3/uL (ref 150–400)
RBC: 4.09 MIL/uL (ref 3.87–5.11)
RDW: 12.9 % (ref 11.5–15.5)
WBC: 14.3 10*3/uL — ABNORMAL HIGH (ref 4.0–10.5)
nRBC: 0 % (ref 0.0–0.2)

## 2022-10-24 LAB — URINALYSIS, ROUTINE W REFLEX MICROSCOPIC
Bacteria, UA: NONE SEEN
Bilirubin Urine: NEGATIVE
Glucose, UA: 150 mg/dL — AB
Hgb urine dipstick: NEGATIVE
Ketones, ur: 20 mg/dL — AB
Nitrite: NEGATIVE
Protein, ur: NEGATIVE mg/dL
Specific Gravity, Urine: 1.029 (ref 1.005–1.030)
pH: 5 (ref 5.0–8.0)

## 2022-10-24 LAB — COMPREHENSIVE METABOLIC PANEL
ALT: 17 U/L (ref 0–44)
AST: 19 U/L (ref 15–41)
Albumin: 3.8 g/dL (ref 3.5–5.0)
Alkaline Phosphatase: 62 U/L (ref 38–126)
Anion gap: 14 (ref 5–15)
BUN: UNDETERMINED mg/dL (ref 8–23)
CO2: 20 mmol/L — ABNORMAL LOW (ref 22–32)
Calcium: 9.3 mg/dL (ref 8.9–10.3)
Chloride: 100 mmol/L (ref 98–111)
Creatinine, Ser: 0.83 mg/dL (ref 0.44–1.00)
GFR, Estimated: >60 mL/min (ref >60)
Glucose, Bld: 221 mg/dL — ABNORMAL HIGH (ref 70–99)
Potassium: 4.3 mmol/L (ref 3.5–5.1)
Sodium: 134 mmol/L — ABNORMAL LOW (ref 135–145)
Total Bilirubin: 0.8 mg/dL (ref 0.3–1.2)
Total Protein: 7.1 g/dL (ref 6.5–8.1)

## 2022-10-24 LAB — LIPASE, BLOOD: Lipase: 29 U/L (ref 11–51)

## 2022-10-24 LAB — TROPONIN I (HIGH SENSITIVITY): Troponin I (High Sensitivity): <2 ng/L (ref <18)

## 2022-10-24 NOTE — ED Triage Notes (Signed)
Pt to triage via wheelchair.  Pt reports abd pain for 2 day.  States hx bowel blockage.  Pt reports vomiting x 5   no diarrhea. Pt alert.

## 2022-10-25 ENCOUNTER — Emergency Department: Payer: BC Managed Care – PPO

## 2022-10-25 ENCOUNTER — Encounter: Payer: Self-pay | Admitting: Internal Medicine

## 2022-10-25 ENCOUNTER — Inpatient Hospital Stay
Admission: EM | Admit: 2022-10-25 | Discharge: 2022-11-01 | DRG: 336 | Disposition: A | Discharge status: Home / Self Care | Payer: BC Managed Care – PPO | Attending: Internal Medicine | Admitting: Internal Medicine

## 2022-10-25 DIAGNOSIS — Z7985 Long-term (current) use of injectable non-insulin antidiabetic drugs: Secondary | ICD-10-CM | POA: Diagnosis not present

## 2022-10-25 DIAGNOSIS — Z91010 Allergy to peanuts: Secondary | ICD-10-CM | POA: Diagnosis not present

## 2022-10-25 DIAGNOSIS — D72829 Elevated white blood cell count, unspecified: Secondary | ICD-10-CM | POA: Diagnosis present

## 2022-10-25 DIAGNOSIS — Z79899 Other long term (current) drug therapy: Secondary | ICD-10-CM | POA: Diagnosis not present

## 2022-10-25 DIAGNOSIS — K660 Peritoneal adhesions (postprocedural) (postinfection): Secondary | ICD-10-CM | POA: Diagnosis present

## 2022-10-25 DIAGNOSIS — K430 Incisional hernia with obstruction, without gangrene: Secondary | ICD-10-CM | POA: Diagnosis present

## 2022-10-25 DIAGNOSIS — R059 Cough, unspecified: Secondary | ICD-10-CM | POA: Diagnosis not present

## 2022-10-25 DIAGNOSIS — E1165 Type 2 diabetes mellitus with hyperglycemia: Secondary | ICD-10-CM | POA: Diagnosis present

## 2022-10-25 DIAGNOSIS — H532 Diplopia: Secondary | ICD-10-CM | POA: Diagnosis not present

## 2022-10-25 DIAGNOSIS — K56609 Unspecified intestinal obstruction, unspecified as to partial versus complete obstruction: Secondary | ICD-10-CM | POA: Diagnosis present

## 2022-10-25 DIAGNOSIS — Z5331 Laparoscopic surgical procedure converted to open procedure: Secondary | ICD-10-CM | POA: Diagnosis not present

## 2022-10-25 DIAGNOSIS — L409 Psoriasis, unspecified: Secondary | ICD-10-CM | POA: Diagnosis present

## 2022-10-25 DIAGNOSIS — E66813 Obesity, class 3: Secondary | ICD-10-CM | POA: Insufficient documentation

## 2022-10-25 DIAGNOSIS — Z888 Allergy status to other drugs, medicaments and biological substances status: Secondary | ICD-10-CM | POA: Diagnosis not present

## 2022-10-25 DIAGNOSIS — Z7984 Long term (current) use of oral hypoglycemic drugs: Secondary | ICD-10-CM | POA: Diagnosis not present

## 2022-10-25 DIAGNOSIS — R9431 Abnormal electrocardiogram [ECG] [EKG]: Secondary | ICD-10-CM | POA: Diagnosis present

## 2022-10-25 DIAGNOSIS — K436 Other and unspecified ventral hernia with obstruction, without gangrene: Principal | ICD-10-CM

## 2022-10-25 DIAGNOSIS — Z8719 Personal history of other diseases of the digestive system: Secondary | ICD-10-CM | POA: Diagnosis not present

## 2022-10-25 DIAGNOSIS — Z9889 Other specified postprocedural states: Secondary | ICD-10-CM | POA: Diagnosis not present

## 2022-10-25 DIAGNOSIS — I10 Essential (primary) hypertension: Secondary | ICD-10-CM | POA: Diagnosis present

## 2022-10-25 DIAGNOSIS — Z6841 Body Mass Index (BMI) 40.0 and over, adult: Secondary | ICD-10-CM | POA: Diagnosis not present

## 2022-10-25 DIAGNOSIS — E876 Hypokalemia: Secondary | ICD-10-CM | POA: Diagnosis not present

## 2022-10-25 HISTORY — DX: Unspecified strabismus: H50.9

## 2022-10-25 HISTORY — DX: Unspecified open-angle glaucoma, stage unspecified: H40.10X0

## 2022-10-25 LAB — HEMOGLOBIN A1C
Hgb A1c MFr Bld: 6.6 % — ABNORMAL HIGH (ref 4.8–5.6)
Mean Plasma Glucose: 142.72 mg/dL

## 2022-10-25 LAB — GLUCOSE, CAPILLARY
Glucose-Capillary: 213 mg/dL — ABNORMAL HIGH (ref 70–99)
Glucose-Capillary: 208 mg/dL — ABNORMAL HIGH (ref 70–99)
Glucose-Capillary: 108 mg/dL — ABNORMAL HIGH (ref 70–99)
Glucose-Capillary: 158 mg/dL — ABNORMAL HIGH (ref 70–99)

## 2022-10-25 LAB — TROPONIN I (HIGH SENSITIVITY): Troponin I (High Sensitivity): <2 ng/L (ref <18)

## 2022-10-25 LAB — HIV ANTIBODY (ROUTINE TESTING W REFLEX): HIV Screen 4th Generation wRfx: NONREACTIVE

## 2022-10-25 LAB — LACTIC ACID, PLASMA: Lactic Acid, Venous: 1.2 mmol/L (ref 0.5–1.9)

## 2022-10-25 LAB — BUN: BUN: 22 mg/dL (ref 8–23)

## 2022-10-25 MED ORDER — HYDRALAZINE HCL 20 MG/ML IJ SOLN: 5.0000 mg | Freq: Four times a day (QID) | INTRAMUSCULAR | Status: DC | PRN

## 2022-10-25 MED ORDER — HYDROMORPHONE HCL 1 MG/ML IJ SOLN
0.5000 mg | INTRAMUSCULAR | Status: DC | PRN
  Administered 2022-10-25 – 2022-10-31 (×13): 0.5 mg via INTRAVENOUS
  Filled 2022-10-25 (×14): qty 0.5

## 2022-10-25 MED ORDER — ACETAMINOPHEN 650 MG RE SUPP: 650.0000 mg | Freq: Four times a day (QID) | RECTAL | Status: DC | PRN

## 2022-10-25 MED ORDER — INSULIN ASPART 100 UNIT/ML IJ SOLN
0.0000 [IU] | Freq: Every day | INTRAMUSCULAR | Status: DC
  Filled 2022-10-25 (×2): qty 1

## 2022-10-25 MED ORDER — LACTATED RINGERS IV BOLUS
1000.0000 mL | Freq: Once | INTRAVENOUS | Status: AC
  Administered 2022-10-25: 1000 mL via INTRAVENOUS

## 2022-10-25 MED ORDER — ENOXAPARIN SODIUM 80 MG/0.8ML IJ SOSY
0.5000 mg/kg | PREFILLED_SYRINGE | INTRAMUSCULAR | Status: DC
  Administered 2022-10-25 – 2022-11-01 (×7): 65 mg via SUBCUTANEOUS
  Filled 2022-10-25 (×9): qty 0.65

## 2022-10-25 MED ORDER — ONDANSETRON HCL 4 MG/2ML IJ SOLN
4.0000 mg | Freq: Four times a day (QID) | INTRAMUSCULAR | Status: DC | PRN
  Administered 2022-10-25 – 2022-10-27 (×3): 4 mg via INTRAVENOUS
  Filled 2022-10-25 (×3): qty 2

## 2022-10-25 MED ORDER — ACETAMINOPHEN 325 MG PO TABS: 650.0000 mg | ORAL_TABLET | Freq: Four times a day (QID) | ORAL | Status: DC | PRN

## 2022-10-25 MED ORDER — PANTOPRAZOLE SODIUM 40 MG IV SOLR
40.0000 mg | INTRAVENOUS | Status: DC
  Administered 2022-10-25 – 2022-10-30 (×6): 40 mg via INTRAVENOUS
  Filled 2022-10-25 (×6): qty 10

## 2022-10-25 MED ORDER — LATANOPROST 0.005 % OP SOLN
1.0000 [drp] | Freq: Every day | OPHTHALMIC | Status: DC
  Administered 2022-10-25 – 2022-10-31 (×6): 1 [drp] via OPHTHALMIC
  Filled 2022-10-25: qty 2.5

## 2022-10-25 MED ORDER — IOHEXOL 350 MG/ML SOLN
100.0000 mL | Freq: Once | INTRAVENOUS | Status: AC | PRN
  Administered 2022-10-25: 100 mL via INTRAVENOUS

## 2022-10-25 MED ORDER — SODIUM CHLORIDE 0.9 % IV SOLN: INTRAVENOUS | Status: AC

## 2022-10-25 MED ORDER — DIATRIZOATE MEGLUMINE & SODIUM 66-10 % PO SOLN
90.0000 mL | Freq: Once | ORAL | Status: AC
  Administered 2022-10-25: 90 mL via NASOGASTRIC

## 2022-10-25 MED ORDER — INSULIN ASPART 100 UNIT/ML IJ SOLN
0.0000 [IU] | Freq: Three times a day (TID) | INTRAMUSCULAR | Status: DC
  Administered 2022-10-25 – 2022-10-26 (×3): 7 [IU] via SUBCUTANEOUS
  Administered 2022-10-26: 3 [IU] via SUBCUTANEOUS
  Administered 2022-10-26 – 2022-10-28 (×4): 4 [IU] via SUBCUTANEOUS
  Administered 2022-10-29: 3 [IU] via SUBCUTANEOUS
  Administered 2022-10-29: 4 [IU] via SUBCUTANEOUS
  Administered 2022-10-30: 3 [IU] via SUBCUTANEOUS
  Administered 2022-10-30: 4 [IU] via SUBCUTANEOUS
  Administered 2022-10-30 – 2022-10-31 (×3): 3 [IU] via SUBCUTANEOUS
  Administered 2022-10-31 – 2022-11-01 (×2): 4 [IU] via SUBCUTANEOUS
  Administered 2022-11-01: 3 [IU] via SUBCUTANEOUS
  Filled 2022-10-25 (×18): qty 1

## 2022-10-25 MED ORDER — ONDANSETRON HCL 4 MG/2ML IJ SOLN
4.0000 mg | Freq: Once | INTRAMUSCULAR | Status: AC
  Administered 2022-10-25: 4 mg via INTRAVENOUS
  Filled 2022-10-25: qty 2

## 2022-10-25 MED ORDER — MORPHINE SULFATE (PF) 4 MG/ML IV SOLN
4.0000 mg | Freq: Once | INTRAVENOUS | Status: AC
  Administered 2022-10-25: 4 mg via INTRAVENOUS
  Filled 2022-10-25: qty 1

## 2022-10-25 NOTE — H&P (Signed)
History and Physical    Holly Simon GEX:[id number] DOB: 10/18/1960 DOA: 10/25/2022  PCP: Enid Baas, MD (Confirm with patient/family/NH records and if not entered, this has to be entered at [facility] point of entry) Patient coming from: Home  I have personally briefly reviewed patient's old medical records in [facility]  Chief Complaint: Belly hurts in the middle  HPI: Holly Simon is a 62 y.o. female with medical history significant of large ventral wall hernia, repeated SBO, HTN, IIDM, morbid obesity, presented with constipation and abdominal pain.  Patient has a history of large ventral wall hernia, with multiple previous SBO before.  She went to see general surgeon for consultation about elective ventral hernia repair however was told that she need to lose some weight to get BMI control before surgery is considering ventral hernia repair.  At baseline, she takes milk of magnesium every day to titrate 1-2 bowel movements a day however since Sunday she became constipated even with laxatives.  Yesterday afternoon she started to have severe cramping like abdominal pain and became constant in the evening and came to ED, she felt nausea but no vomiting denies any fever or chills. ED Course: Afebrile, none tachycardia nonhypotensive.  X-ray showed SBO with incarcerated ventral hernia.  Blood work showed WBC 14.3 hemoglobin 12.9, creatinine 0.8, bicarb 20, glucose 221.  NG tube placed in and on suction patient was given multiple rounds of Zofran and morphine in the ED  Review of Systems: As per HPI otherwise 14 point review of systems negative.   Past Medical History:  Diagnosis Date   Bowel obstruction (HCC)    Diabetes mellitus without complication (HCC)    Hypertension     Past Surgical History:  Procedure Laterality Date   VENTRAL HERNIA REPAIR  2001     reports that she has never smoked. She has never used smokeless tobacco. She reports that she does not currently use  alcohol. She reports that she does not use drugs.  Allergies  Allergen Reactions   Peanut-Containing Drug Products Nausea And Vomiting and Other (See Comments)    Bloating, Abdominal Pain.    History reviewed. No pertinent family history.   Prior to Admission medications   Medication Sig Start Date End Date Taking? Authorizing Provider  lisinopril (PRINIVIL,ZESTRIL) 10 MG tablet Take 10 mg by mouth daily.   Yes [provider]  metFORMIN (GLUCOPHAGE) 500 MG tablet Take 500 mg by mouth 2 (two) times daily.   Yes [provider]  pioglitazone (ACTOS) 45 MG tablet Take 45 mg by mouth daily.   Yes [provider]  Calcipotriene-Betameth Diprop (ENSTILAR) 0.005-0.064 % FOAM Apply to aa's psoriasis BID x 2 weeks. Then decrease use to QD PRN. Avoid f/g/a. 04/06/20   Deirdre Evener, MD  glimepiride (AMARYL) 2 MG tablet Take 2 mg by mouth 2 (two) times daily. Patient not taking: Reported on 10/25/2022    [provider]  hydrocortisone 2.5 % lotion Apply to aa's face QD on Monday, Wednesday, and Friday as needed. 04/06/20   Deirdre Evener, MD  latanoprost (XALATAN) 0.005 % ophthalmic solution Place 1 drop into both eyes at bedtime.    [provider]  magnesium oxide (MAG-OX) 400 MG tablet Take 400 mg by mouth daily.    [provider]  Magnesium Oxide 250 MG TABS Take 250 mg by mouth 2 (two) times daily.    [provider]  metFORMIN (GLUCOPHAGE) 1000 MG tablet Take 1,000-1,500 mg by  mouth 2 (two) times daily with a meal. Take 1000 mg in the morning, 1500 mg at supper. Patient not taking: Reported on 10/25/2022    [provider]  Potassium 99 MG TABS Take 1 tablet by mouth daily.    [provider]    Physical Exam: Vitals:   10/24/22 2154 10/25/22 0102 10/25/22 0700 10/25/22 0730  BP:  133/87  (!) 145/61  Pulse:  62 (!) 59 81  Resp:  16 16 20   Temp:  97.7 F (36.5 C) 98.5 F (36.9 C)   TempSrc:  Oral     SpO2:  100% 96% 100%  Weight: 129.3 kg     Height: 5' (1.524 m)       Constitutional: NAD, calm, comfortable Vitals:   10/24/22 2154 10/25/22 0102 10/25/22 0700 10/25/22 0730  BP:  133/87  (!) 145/61  Pulse:  62 (!) 59 81  Resp:  16 16 20   Temp:  97.7 F (36.5 C) 98.5 F (36.9 C)   TempSrc:  Oral    SpO2:  100% 96% 100%  Weight: 129.3 kg     Height: 5' (1.524 m)      Eyes: PERRL, lids and conjunctivae normal ENMT: Mucous membranes are moist. Posterior pharynx clear of any exudate or lesions.Normal dentition.  Neck: normal, supple, no masses, no thyromegaly Respiratory: clear to auscultation bilaterally, no wheezing, no crackles. Normal respiratory effort. No accessory muscle use.  Cardiovascular: Regular rate and rhythm, no murmurs / rubs / gallops. No extremity edema. 2+ pedal pulses. No carotid bruits.  Abdomen: Large ventral hernia, severe tenderness on periumbilical area, with increasing bowel sounds no masses palpated. No hepatosplenomegaly. Bowel sounds positive.  Musculoskeletal: no clubbing / cyanosis. No joint deformity upper and lower extremities. Good ROM, no contractures. Normal muscle tone.  Skin: no rashes, lesions, ulcers. No induration Neurologic: CN 2-12 grossly intact. Sensation intact, DTR normal. Strength 5/5 in all 4.  Psychiatric: Normal judgment and insight. Alert and oriented x 3. Normal mood.     Labs on Admission: I have personally reviewed following labs and imaging studies  CBC: Recent Labs  Lab 10/24/22 2156  WBC 14.3*  HGB 12.9  HCT 41.7  MCV 102.0*  PLT 196   Basic Metabolic Panel: Recent Labs  Lab 10/24/22 2156 10/25/22 0105  NA 134*  --   K 4.3  --   CL 100  --   CO2 20*  --   GLUCOSE 221*  --   BUN QUANTITY NOT SUFFICIENT, UNABLE TO PERFORM TEST 22  CREATININE 0.83  --   CALCIUM 9.3  --    GFR: Estimated Creatinine Clearance: 85.4 mL/min (by C-G formula based on SCr of 0.83 mg/dL). Liver Function Tests: Recent Labs  Lab  10/24/22 2156  AST 19  ALT 17  ALKPHOS 62  BILITOT 0.8  PROT 7.1  ALBUMIN 3.8   Recent Labs  Lab 10/24/22 2156  LIPASE 29   No results for input(s): "AMMONIA" in the last 168 hours. Coagulation Profile: No results for input(s): "INR", "PROTIME" in the last 168 hours. Cardiac Enzymes: No results for input(s): "CKTOTAL", "CKMB", "CKMBINDEX", "TROPONINI" in the last 168 hours. BNP (last 3 results) No results for input(s): "PROBNP" in the last 8760 hours. HbA1C: No results for input(s): "HGBA1C" in the last 72 hours. CBG: No results for input(s): "GLUCAP" in the last 168 hours. Lipid Profile: No results for input(s): "CHOL", "HDL", "LDLCALC", "TRIG", "CHOLHDL", "LDLDIRECT" in the last 72 hours. Thyroid Function Tests:  No results for input(s): "TSH", "T4TOTAL", "FREET4", "T3FREE", "THYROIDAB" in the last 72 hours. Anemia Panel: No results for input(s): "VITAMINB12", "FOLATE", "FERRITIN", "TIBC", "IRON", "RETICCTPCT" in the last 72 hours. Urine analysis:    Component Value Date/Time   COLORURINE YELLOW (A) 10/24/2022 2143   APPEARANCEUR HAZY (A) 10/24/2022 2143   LABSPEC 1.029 10/24/2022 2143   PHURINE 5.0 10/24/2022 2143   GLUCOSEU 150 (A) 10/24/2022 2143   HGBUR NEGATIVE 10/24/2022 2143   BILIRUBINUR NEGATIVE 10/24/2022 2143   KETONESUR 20 (A) 10/24/2022 2143   PROTEINUR NEGATIVE 10/24/2022 2143   NITRITE NEGATIVE 10/24/2022 2143   LEUKOCYTESUR SMALL (A) 10/24/2022 2143    Radiological Exams on Admission: CT ABDOMEN PELVIS W CONTRAST  Result Date: 10/25/2022 CLINICAL DATA:  Evaluate for bowel obstruction. EXAM: CT ABDOMEN AND PELVIS WITH CONTRAST TECHNIQUE: Multidetector CT imaging of the abdomen and pelvis was performed using the standard protocol following bolus administration of intravenous contrast. RADIATION DOSE REDUCTION: This exam was performed according to the departmental dose-optimization program which includes automated exposure control, adjustment of the mA  and/or kV according to patient size and/or use of iterative reconstruction technique. CONTRAST:  OMNIPAQUE IOHEXOL 350 MG/ML SOLN COMPARISON:  07/12/2019 FINDINGS: Lower chest: No acute abnormality. Hepatobiliary: No suspicious liver abnormality. Small stones within the dependent portion of the gallbladder are identified measuring up to 5 mm. No gallbladder wall thickening or inflammation. Pancreas: Unremarkable. No pancreatic ductal dilatation or surrounding inflammatory changes. Spleen: Normal in size without focal abnormality. Adrenals/Urinary Tract: Adrenal glands are unremarkable. Kidneys are normal, without renal calculi, focal lesion, or hydronephrosis. Bladder is unremarkable. Stomach/Bowel: Small hiatal hernia. Stomach is nondistended. Multiple dilated loops of mid and distal small bowel are identified with scattered air-fluid levels. Small bowel loops measure up to 3.7 cm in diameter. Imaging findings are compatible with a small-bowel obstruction secondary to large anterior abdominal wall hernia. The transition to decreased caliber distal small bowel is noted within the left side of the hernia, image 73/2. There is fluid and soft tissue stranding surrounding the herniated bowel loops suggesting a degree of incarceration, similar in appearance to the previous exam. Normal caliber of the colon. The appendix is visualized and appears normal. Vascular/Lymphatic: Aortic atherosclerosis without aneurysm. No abdominopelvic adenopathy. Reproductive: Uterus and bilateral adnexa are unremarkable. Other: Free fluid identified within the ventral abdominal wall hernia. No focal fluid collections. No signs of pneumoperitoneum. Large ventral abdominal wall hernia as above. Musculoskeletal: No acute or suspicious osseous findings. IMPRESSION: 1. Small-bowel obstruction secondary to large ventral abdominal wall hernia. Transition to decreased caliber distal small bowel is noted within the left side of the hernia.  There is fluid and soft tissue stranding surrounding the herniated bowel loops suggesting a degree of incarceration, similar in appearance to the previous exam. 2. Cholelithiasis without evidence of acute cholecystitis. 3. Aortic Atherosclerosis (ICD10-I70.0). Electronically Signed   By: Signa Kell M.D.   On: 10/25/2022 07:08    EKG: Independently reviewed.  Sinus rhythm, ST changes on lead III aVF, V3-V5  Assessment/Plan Principal Problem:   SBO (small bowel obstruction) (HCC) Active Problems:   Small bowel obstruction (HCC)   Obesity, Class III, BMI 40-49.9 (morbid obesity) (HCC)  (please populate well all problems here in Problem List. (For example, if patient is on BP meds at home and you resume or decide to hold them, it is a problem that needs to be her. Same for CAD, COPD, HLD and so on)  SBO Large incarcerated ventral hernia -NG tube,  n.p.o., IV fluid -As needed Dilaudid and as needed Zofran -Emergency surgical consult to follow this morning -GI prophylaxis with PPI IV  Leukocytosis -Likely reactive to SBO, hold off antibiotics  HTN -Hold off home BP meds, start as needed hydralazine  IIDM -Hold off metformin glimepiride and pioglitazone -Start SSI  Morbid obesity -Outpatient Ozempic evaluation -Calorie control  Abnormal EKG -No chest pains, troponin negative x 2, ACS ruled out. -Repeat EKG tomorrow, suspect ST changes probably chronic. -Outpatient echocardiogram   DVT prophylaxis: Lovenox Code Status: Full code Family Communication: None at bedside Disposition Plan: Patient is sick with SBO requiring NG tube and inpatient surgical consultation, expect more than 2 midnight hospital stay Consults called: General Surgery Admission status: MedSurg admission   Emeline General MD [facility] Pager [phone]  10/25/2022, 8:15 AM

## 2022-10-25 NOTE — Progress Notes (Signed)
Patient ID: Holly Simon, female   DOB: 1960/07/08, 62 y.o.   MRN: [id number]  Micah Flesher to see patient but patient was busy sending an email on the phone. She was unable to ask questions or get examined at the moment. Will return for full evaluation at a later time. NGT was already in place.   Dr. Hazle Quant

## 2022-10-25 NOTE — Consult Note (Signed)
SURGICAL CONSULTATION NOTE   HISTORY OF PRESENT ILLNESS (HPI):  63 y.o. female presented to [facility] ED for evaluation of nausea and vomiting for 5 days. Patient reports she has been feeling nauseated for the last few days.  She endorses that she has some lower abdominal pain.  No specific pain radiation.  Patient cannot identify any alleviating or aggravating factors.  Patient had a previous history of bowel obstruction.  Previous history of hernia repair.  At the ED she was found with pain on palpation in the lower abdomen.  She was found with stable vital signs.  There was leukocytosis of 14.3, no significant electrolyte disturbance.  Elevated glucose.  She had a CT scan of the abdomen and pelvis that showed sign of bowel obstructions with small bowel dilation with ventral hernia.  No significant changes from previous CT scan 3 years ago.  Surgery is consulted by Dr. Chipper Herb in this context for evaluation and management of small bowel obstruction.  PAST MEDICAL HISTORY (PMH):  Past Medical History:  Diagnosis Date   Bowel obstruction (HCC)    Diabetes mellitus without complication (HCC)    Hypertension      PAST SURGICAL HISTORY (PSH):  Past Surgical History:  Procedure Laterality Date   VENTRAL HERNIA REPAIR  2001     MEDICATIONS:  Prior to Admission medications   Medication Sig Start Date End Date Taking? Authorizing Provider  hydrocortisone 2.5 % lotion Apply to aa's face QD on Monday, Wednesday, and Friday as needed. 04/06/20  Yes Deirdre Evener, MD  latanoprost (XALATAN) 0.005 % ophthalmic solution Place 1 drop into both eyes at bedtime.   Yes [provider]  lisinopril (ZESTRIL) 20 MG tablet Take 20 mg by mouth daily.   Yes [provider]  magnesium oxide (MAG-OX) 400 MG tablet Take 400 mg by mouth daily.   Yes [provider]  metFORMIN (GLUCOPHAGE) 500 MG tablet Take 500 mg by mouth 2 (two) times daily.   Yes [provider]  pioglitazone  (ACTOS) 45 MG tablet Take 45 mg by mouth daily.   Yes [provider]  TRULICITY 1.5 MG/0.5ML SOPN Inject 1.5 mg into the skin once a week. 07/24/22  Yes [provider]  Calcipotriene-Betameth Diprop (ENSTILAR) 0.005-0.064 % FOAM Apply to aa's psoriasis BID x 2 weeks. Then decrease use to QD PRN. Avoid f/g/a. 04/06/20   Deirdre Evener, MD  Magnesium Oxide 250 MG TABS Take 250 mg by mouth 2 (two) times daily.    [provider]  Potassium 99 MG TABS Take 1 tablet by mouth daily.    [provider]     ALLERGIES:  Allergies  Allergen Reactions   Peanut-Containing Drug Products Nausea And Vomiting and Other (See Comments)    Bloating, Abdominal Pain.     SOCIAL HISTORY:  Social History   Socioeconomic History   Marital status: Married    Spouse name: Not on file   Number of children: Not on file   Years of education: Not on file   Highest education level: Not on file  Occupational History   Not on file  Tobacco Use   Smoking status: Never   Smokeless tobacco: Never  Substance and Sexual Activity   Alcohol use: Not Currently   Drug use: Never   Sexual activity: Not on file  Other Topics Concern   Not on file  Social History Narrative   Not on file   Social Determinants of Health   Financial  Resource Strain: Low Risk  (10/05/2022)   Received from [facility]   Overall Financial Resource Strain (CARDIA)    Difficulty of Paying Living Expenses: Not hard at all  Food Insecurity: No Food Insecurity (10/25/2022)   Hunger Vital Sign    Worried About Running Out of Food in the Last Year: Never true    Ran Out of Food in the Last Year: Never true  Transportation Needs: No Transportation Needs (10/25/2022)   PRAPARE - Administrator, Civil Service (Medical): No    Lack of Transportation (Non-Medical): No  Physical Activity: Not on file  Stress: Not on file  Social Connections: Not on file  Intimate Partner Violence:  Not At Risk (10/25/2022)   Humiliation, Afraid, Rape, and Kick questionnaire    Fear of Current or Ex-Partner: No    Emotionally Abused: No    Physically Abused: No    Sexually Abused: No      FAMILY HISTORY:  History reviewed. No pertinent family history.   REVIEW OF SYSTEMS:  Constitutional: denies weight loss, fever, chills, or sweats  Eyes: denies any other vision changes, history of eye injury  ENT: denies sore throat, hearing problems  Respiratory: denies shortness of breath, wheezing  Cardiovascular: denies chest pain, palpitations  Gastrointestinal: Positive abdominal pain, nausea and vomiting Genitourinary: denies burning with urination or urinary frequency Musculoskeletal: denies any other joint pains or cramps  Skin: denies any other rashes or skin discolorations  Neurological: denies any other headache, dizziness, weakness  Psychiatric: denies any other depression, anxiety   All other review of systems were negative   VITAL SIGNS:  Temp:  [97.7 F (36.5 C)-99.2 F (37.3 C)] 98.9 F (37.2 C) (09/24 1455) Pulse Rate:  [59-81] 77 (09/24 1455) Resp:  [15-20] 18 (09/24 1455) BP: (132-150)/(56-87) 132/58 (09/24 1455) SpO2:  [96 %-100 %] 99 % (09/24 1455) Weight:  [129.3 kg] 129.3 kg (09/23 2154)     Height: 5' (152.4 cm) Weight: 129.3 kg BMI (Calculated): 55.66   INTAKE/OUTPUT:  This shift: No intake/output data recorded.  Last 2 shifts: @IOLAST2SHIFTS @   PHYSICAL EXAM:  Constitutional:  -- Really obese -- Awake, alert, and oriented x3  Eyes:  -- Pupils equally round and reactive to light  -- No scleral icterus  Ear, nose, and throat:  -- No jugular venous distension  Pulmonary:  -- No crackles  -- Equal breath sounds bilaterally -- Breathing non-labored at rest Cardiovascular:  -- S1, S2 present  -- No pericardial rubs Gastrointestinal:  -- Abdomen soft, tender in the infraumbilical area were a palpable hernia contents are felt, non-distended, no  guarding or rebound tenderness -- No abdominal masses appreciated, pulsatile or otherwise  Musculoskeletal and Integumentary:  -- Wounds: None appreciated -- Extremities: B/L UE and LE FROM, hands and feet warm, no edema  Neurologic:  -- Motor function: intact and symmetric -- Sensation: intact and symmetric   Labs:     Latest Ref Rng & Units 10/24/2022    9:56 PM 07/14/2019    6:26 AM 07/13/2019    4:52 AM  CBC  WBC 4.0 - 10.5 K/uL 14.3  5.2  3.6   Hemoglobin 12.0 - 15.0 g/dL 40.9  81.1  91.4   Hematocrit 36.0 - 46.0 % 41.7  32.2  35.7   Platelets 150 - 400 K/uL 196  173  202       Latest Ref Rng & Units 10/25/2022    1:05 AM 10/24/2022  9:56 PM 07/14/2019    6:26 AM  CMP  Glucose 70 - 99 mg/dL  161  096   BUN 8 - 23 mg/dL 22  QUANTITY NOT SUFFICIENT, UNABLE TO PERFORM TEST  16   Creatinine 0.44 - 1.00 mg/dL  0.45  4.09   Sodium 811 - 145 mmol/L  134  140   Potassium 3.5 - 5.1 mmol/L  4.3  3.1   Chloride 98 - 111 mmol/L  100  105   CO2 22 - 32 mmol/L  20  27   Calcium 8.9 - 10.3 mg/dL  9.3  8.1   Total Protein 6.5 - 8.1 g/dL  7.1    Total Bilirubin 0.3 - 1.2 mg/dL  0.8    Alkaline Phos 38 - 126 U/L  62    AST 15 - 41 U/L  19    ALT 0 - 44 U/L  17      Imaging studies:  EXAM: CT ABDOMEN AND PELVIS WITH CONTRAST   TECHNIQUE: Multidetector CT imaging of the abdomen and pelvis was performed using the standard protocol following bolus administration of intravenous contrast.   RADIATION DOSE REDUCTION: This exam was performed according to the departmental dose-optimization program which includes automated exposure control, adjustment of the mA and/or kV according to patient size and/or use of iterative reconstruction technique.   CONTRAST:  OMNIPAQUE IOHEXOL 350 MG/ML SOLN   COMPARISON:  07/12/2019   FINDINGS: Lower chest: No acute abnormality.   Hepatobiliary: No suspicious liver abnormality. Small stones within the dependent portion of the gallbladder  are identified measuring up to 5 mm. No gallbladder wall thickening or inflammation.   Pancreas: Unremarkable. No pancreatic ductal dilatation or surrounding inflammatory changes.   Spleen: Normal in size without focal abnormality.   Adrenals/Urinary Tract: Adrenal glands are unremarkable. Kidneys are normal, without renal calculi, focal lesion, or hydronephrosis. Bladder is unremarkable.   Stomach/Bowel: Small hiatal hernia. Stomach is nondistended. Multiple dilated loops of mid and distal small bowel are identified with scattered air-fluid levels. Small bowel loops measure up to 3.7 cm in diameter. Imaging findings are compatible with a small-bowel obstruction secondary to large anterior abdominal wall hernia. The transition to decreased caliber distal small bowel is noted within the left side of the hernia, image 73/2. There is fluid and soft tissue stranding surrounding the herniated bowel loops suggesting a degree of incarceration, similar in appearance to the previous exam. Normal caliber of the colon. The appendix is visualized and appears normal.   Vascular/Lymphatic: Aortic atherosclerosis without aneurysm. No abdominopelvic adenopathy.   Reproductive: Uterus and bilateral adnexa are unremarkable.   Other: Free fluid identified within the ventral abdominal wall hernia. No focal fluid collections. No signs of pneumoperitoneum. Large ventral abdominal wall hernia as above.   Musculoskeletal: No acute or suspicious osseous findings.   IMPRESSION: 1. Small-bowel obstruction secondary to large ventral abdominal wall hernia. Transition to decreased caliber distal small bowel is noted within the left side of the hernia. There is fluid and soft tissue stranding surrounding the herniated bowel loops suggesting a degree of incarceration, similar in appearance to the previous exam. 2. Cholelithiasis without evidence of acute cholecystitis. 3. Aortic Atherosclerosis  (ICD10-I70.0).     Electronically Signed   By: Signa Kell M.D.   On: 10/25/2022 07:08    Assessment/Plan:  62 y.o. female with small bowel obstruction, complicated by pertinent comorbidities including recurrent ventral hernia, morbid obesity, type 2 diabetes, hypertension.  Patient with recurrent SBO.  She  has history of hernia repair about 20 years ago.  She had a recent episode of bowel obstruction 3 years ago.  Physical exam without acute abdomen.  Will try to decompress the intestine with with nasogastric tube.  If no resolution of bowel obstruction will need to do a temporary fix of this hernia.  Discussed with patient that due to her morbid obesity any surgical intervention for hernia repair there is a very high percentage of recurrence and  he will be considered temporarily for resolution of the acute problem.  Discussed with patient that she is high risk of complications such as injury to intra-abdominal organs, DVTs, pulmonary complications, among others.  The patient reports she understood and agreed with plan with nasogastric tube decompression, Gastrografin contrast given.  Follow closely and if no improvement will need surgical intervention.  Gae Gallop, MD

## 2022-10-25 NOTE — Inpatient Diabetes Management (Signed)
Inpatient Diabetes Program Recommendations  AACE/ADA: New Consensus Statement on Inpatient Glycemic Control   Target Ranges:  Prepandial:   less than 140 mg/dL      Peak postprandial:   less than 180 mg/dL (1-2 hours)      Critically ill patients:  140 - 180 mg/dL    Latest Reference Range & Units 10/25/22 08:58 10/25/22 12:01  Glucose-Capillary 70 - 99 mg/dL 784 (H) 696 (H)     Latest Reference Range & Units 10/25/22 08:56  Hemoglobin A1C 4.8 - 5.6 % 6.6 (H)   Review of Glycemic Control  Diabetes history: DM2 Outpatient Diabetes medications: Metformin 500 mg BID, Actos 45 mg daily, Trulicity 1.5 mg Qweek Current orders for Inpatient glycemic control: Novolog 0-20 units TID with meals, Novolog 0-5 units QHS  Inpatient Diabetes Program Recommendations:    Insulin: If CBGs are consistently over 180 mg/dl with Novolog correction, please consider ordering Semglee 10 units Q24H.  Thanks, Orlando Penner, RN, MSN, CDCES Diabetes Coordinator Inpatient Diabetes Program [phone] (Team Pager from 8am to 5pm)

## 2022-10-25 NOTE — ED Provider Notes (Signed)
62 year old female here with nausea, vomiting, abdominal distention.  Imaging shows small bowel obstruction with incarceration of ventral hernia.  Patient has been treated conservatively in the past for this based on review of records from 2021.  Discussed case with Dr. Maia Plan, who recommends NG tube and medicine admission.  Patient updated and is in agreement with this plan.   Shaune Pollack, MD 10/25/22 [phone]

## 2022-10-25 NOTE — ED Provider Notes (Signed)
[facility] Provider Note    Event Date/Time   First MD Initiated Contact with Patient 10/25/22 0424     (approximate)   History   Chief Complaint Abdominal Pain   HPI  Holly Simon is a 62 y.o. female with past medical history of hypertension, diabetes, and bowel obstruction who presents to the ED complaining of abdominal pain.  Patient reports that she has had increasing abdominal pain and distention over the past 24 hours with nausea and multiple episodes of vomiting.  She states that symptoms feel similar to prior bowel obstruction which was related to ventral hernia repair she had performed greater than 10 years ago.  She has been unable to keep anything down over the past 24 hours, denies associated diarrhea.  She has not had any fevers, dysuria, or flank pain.     Physical Exam   Triage Vital Signs: ED Triage Vitals  Encounter Vitals Group     BP 10/25/22 0102 133/87     Systolic BP Percentile --      Diastolic BP Percentile --      Pulse Rate 10/25/22 0102 62     Resp 10/25/22 0102 16     Temp 10/25/22 0102 97.7 F (36.5 C)     Temp Source 10/25/22 0102 Oral     SpO2 10/25/22 0102 100 %     Weight 10/24/22 2154 285 lb (129.3 kg)     Height 10/24/22 2154 5' (1.524 m)     Head Circumference --      Peak Flow --      Pain Score 10/24/22 2153 8     Pain Loc --      Pain Education --      Exclude from Growth Chart --     Most recent vital signs: Vitals:   10/25/22 0102  BP: 133/87  Pulse: 62  Resp: 16  Temp: 97.7 F (36.5 C)  SpO2: 100%    Constitutional: Alert and oriented. Eyes: Conjunctivae are normal. Head: Atraumatic. Nose: No congestion/rhinnorhea. Mouth/Throat: Mucous membranes are moist.  Cardiovascular: Normal rate, regular rhythm. Grossly normal heart sounds.  2+ radial pulses bilaterally. Respiratory: Normal respiratory effort.  No retractions. Lungs CTAB. Gastrointestinal: Soft and distended with diffuse tenderness  to palpation. Musculoskeletal: No lower extremity tenderness nor edema.  Neurologic:  Normal speech and language. No gross focal neurologic deficits are appreciated.    ED Results / Procedures / Treatments   Labs (all labs ordered are listed, but only abnormal results are displayed) Labs Reviewed  COMPREHENSIVE METABOLIC PANEL - Abnormal; Notable for the following components:      Result Value   Sodium 134 (*)    CO2 20 (*)    Glucose, Bld 221 (*)    All other components within normal limits  CBC - Abnormal; Notable for the following components:   WBC 14.3 (*)    MCV 102.0 (*)    All other components within normal limits  URINALYSIS, ROUTINE W REFLEX MICROSCOPIC - Abnormal; Notable for the following components:   Color, Urine YELLOW (*)    APPearance HAZY (*)    Glucose, UA 150 (*)    Ketones, ur 20 (*)    Leukocytes,Ua SMALL (*)    All other components within normal limits  LIPASE, BLOOD  BUN  LACTIC ACID, PLASMA  TROPONIN I (HIGH SENSITIVITY)  TROPONIN I (HIGH SENSITIVITY)     EKG  ED ECG REPORT I, Chesley Noon, the attending physician,  personally viewed and interpreted this ECG.   Date: 10/25/2022  EKG Time: 21:59  Rate: 54  Rhythm: sinus bradycardia  Axis: Normal  Intervals:none  ST&T Change: Inferolateral T wave inversions  RADIOLOGY CT abdomen/pelvis reviewed and interpreted by me with large ventral hernia and dilated bowel loops concerning for obstruction.  PROCEDURES:  Critical Care performed: No  Procedures   MEDICATIONS ORDERED IN ED: Medications  morphine (PF) 4 MG/ML injection 4 mg (4 mg Intravenous Given 10/25/22 0534)  ondansetron (ZOFRAN) injection 4 mg (4 mg Intravenous Given 10/25/22 0530)  lactated ringers bolus 1,000 mL (1,000 mLs Intravenous New Bag/Given 10/25/22 0533)  iohexol (OMNIPAQUE) 350 MG/ML injection 100 mL (100 mLs Intravenous Contrast Given 10/25/22 0543)     IMPRESSION / MDM / ASSESSMENT AND PLAN / ED COURSE  I  reviewed the triage vital signs and the nursing notes.                              62 y.o. female with past medical history of hypertension, diabetes, and bowel obstruction who presents to the ED complaining of increasing abdominal pain and distention with vomiting over the past 24 hours.  Patient's presentation is most consistent with acute presentation with potential threat to life or bodily function.  Differential diagnosis includes, but is not limited to, bowel obstruction, recurrent hernia, cholecystitis, biliary colic, pancreatitis, hepatitis, colitis, ACS.  Patient nontoxic-appearing and in no acute distress, vital signs are unremarkable.  Her abdomen is soft with diffuse distention and tenderness, presentation concerning for bowel obstruction and we will further assess with CT imaging.  EKG does show new inferolateral T wave inversions, however symptoms do not appear concerning for ACS and 2 sets of troponin are within normal limits.  Labs show leukocytosis with no significant anemia, electrolyte abnormality, or AKI.  LFTs and lipase are also unremarkable.  Patient turned over to oncoming provider pending CT results, anticipate admission for bowel obstruction.      FINAL CLINICAL IMPRESSION(S) / ED DIAGNOSES   Final diagnoses:  Ventral hernia with obstruction and without gangrene     Rx / DC Orders   ED Discharge Orders     None        Note:  This document was prepared using Dragon voice recognition software and may include unintentional dictation errors.   Chesley Noon, MD 10/25/22 [phone]

## 2022-10-25 NOTE — ED Notes (Signed)
ED TO INPATIENT HANDOFF REPORT  ED Nurse Name and Phone #: Virgilio Belling Name/Age/Gender Holly Simon 62 y.o. female Room/Bed: ED03A/ED03A  Code Status   Code Status: Full Code  Home/SNF/Other Home Patient oriented to: self, place, time, and situation Is this baseline? Yes   Triage Complete: Triage complete  Chief Complaint SBO (small bowel obstruction) (HCC) [K56.609]  Triage Note Pt to triage via wheelchair.  Pt reports abd pain for 2 day.  States hx bowel blockage.  Pt reports vomiting x 5   no diarrhea. Pt alert.      Allergies Allergies  Allergen Reactions   Peanut-Containing Drug Products Nausea And Vomiting and Other (See Comments)    Bloating, Abdominal Pain.    Level of Care/Admitting Diagnosis ED Disposition     ED Disposition  Admit   Condition  --   Comment  Hospital Area: [facility] [[id number]]  Level of Care: Med-Surg [16]  Covid Evaluation: Asymptomatic - no recent exposure (last 10 days) testing not required  Diagnosis: SBO (small bowel obstruction) [facility]) [[id number]]  Admitting Physician: Emeline General [[id number]]  Attending Physician: Emeline General [[id number]]  Certification:: I certify this patient will need inpatient services for at least 2 midnights  Expected Medical Readiness: 10/27/2022          B Medical/Surgery History Past Medical History:  Diagnosis Date   Bowel obstruction (HCC)    Diabetes mellitus without complication (HCC)    Hypertension    Past Surgical History:  Procedure Laterality Date   VENTRAL HERNIA REPAIR  2001     A IV Location/Drains/Wounds Patient Lines/Drains/Airways Status     Active Line/Drains/Airways     Name Placement date Placement time Site Days   Peripheral IV 10/25/22 20 G 1.88" Left Antecubital 10/25/22  0521  Antecubital  less than 1   NG/OG Vented/Dual Lumen 16 Fr. Right nare Marking at nare/corner of mouth 65 cm 10/25/22  0730  Right nare  less than 1             Intake/Output Last 24 hours No intake or output data in the 24 hours ending 10/25/22 0758  Labs/Imaging Results for orders placed or performed during the hospital encounter of 10/25/22 (from the past 48 hour(s))  Urinalysis, Routine w reflex microscopic -Urine, Clean Catch     Status: Abnormal   Collection Time: 10/24/22  9:43 PM  Result Value Ref Range   Color, Urine YELLOW (A) YELLOW   APPearance HAZY (A) CLEAR   Specific Gravity, Urine 1.029 1.005 - 1.030   pH 5.0 5.0 - 8.0   Glucose, UA 150 (A) NEGATIVE mg/dL   Hgb urine dipstick NEGATIVE NEGATIVE   Bilirubin Urine NEGATIVE NEGATIVE   Ketones, ur 20 (A) NEGATIVE mg/dL   Protein, ur NEGATIVE NEGATIVE mg/dL   Nitrite NEGATIVE NEGATIVE   Leukocytes,Ua SMALL (A) NEGATIVE   RBC / HPF 0-5 0 - 5 RBC/hpf   WBC, UA 6-10 0 - 5 WBC/hpf   Bacteria, UA NONE SEEN NONE SEEN   Squamous Epithelial / HPF 0-5 0 - 5 /HPF   Mucus PRESENT     Comment: Performed at [facility], [address] [place], [place] [address]  Lipase, blood     Status: None   Collection Time: 10/24/22  9:56 PM  Result Value Ref Range   Lipase 29 11 - 51 U/L    Comment: Performed at [facility], [address] [place], [place]  [address]  Comprehensive metabolic panel     Status: Abnormal   Collection Time: 10/24/22  9:56 PM  Result Value Ref Range   Sodium 134 (L) 135 - 145 mmol/L   Potassium 4.3 3.5 - 5.1 mmol/L   Chloride 100 98 - 111 mmol/L   CO2 20 (L) 22 - 32 mmol/L   Glucose, Bld 221 (H) 70 - 99 mg/dL    Comment: Glucose reference range applies only to samples taken after fasting for at least 8 hours.   BUN QUANTITY NOT SUFFICIENT, UNABLE TO PERFORM TEST 8 - 23 mg/dL    Comment: LISA THOMPSON @2247  ON 10/24/22 SKL   Creatinine, Ser 0.83 0.44 - 1.00 mg/dL   Calcium 9.3 8.9 - 84.1 mg/dL   Total Protein 7.1 6.5 - 8.1 g/dL   Albumin 3.8 3.5 - 5.0 g/dL   AST 19 15 - 41 U/L   ALT 17 0 - 44 U/L   Alkaline Phosphatase 62 38 - 126 U/L    Total Bilirubin 0.8 0.3 - 1.2 mg/dL   GFR, Estimated >32 >44 mL/min    Comment: (NOTE) Calculated using the CKD-EPI Creatinine Equation (2021)    Anion gap 14 5 - 15    Comment: Performed at [facility], [address] [place], [place] [address]  CBC     Status: Abnormal   Collection Time: 10/24/22  9:56 PM  Result Value Ref Range   WBC 14.3 (H) 4.0 - 10.5 K/uL   RBC 4.09 3.87 - 5.11 MIL/uL   Hemoglobin 12.9 12.0 - 15.0 g/dL   HCT 25.3 66.4 - 40.3 %   MCV 102.0 (H) 80.0 - 100.0 fL   MCH 31.5 26.0 - 34.0 pg   MCHC 30.9 30.0 - 36.0 g/dL   RDW 47.4 25.9 - 56.3 %   Platelets 196 150 - 400 K/uL   nRBC 0.0 0.0 - 0.2 %    Comment: Performed at [facility], [address], [place], [place] [address]  Troponin I (High Sensitivity)     Status: None   Collection Time: 10/24/22  9:56 PM  Result Value Ref Range   Troponin I (High Sensitivity) <2 <18 ng/L    Comment: (NOTE) Elevated high sensitivity troponin I (hsTnI) values and significant  changes across serial measurements may suggest ACS but many other  chronic and acute conditions are known to elevate hsTnI results.  Refer to the Links section for chest pain algorithms and additional  guidance. Performed at [facility], [address] [place], [place] [address]   BUN     Status: None   Collection Time: 10/25/22  1:05 AM  Result Value Ref Range   BUN 22 8 - 23 mg/dL    Comment: Performed at [facility], [address] [place], [place] [address]  Troponin I (High Sensitivity)     Status: None   Collection Time: 10/25/22  1:05 AM  Result Value Ref Range   Troponin I (High Sensitivity) <2 <18 ng/L    Comment: (NOTE) Elevated high sensitivity troponin I (hsTnI) values and significant  changes across serial measurements may suggest ACS but many other  chronic and acute conditions are known to elevate hsTnI results.  Refer to the "Links" section for chest pain algorithms and additional   guidance. Performed at [facility], [address] [place], [place] [address]   Lactic acid, plasma     Status: None   Collection Time: 10/25/22  5:21 AM  Result Value Ref Range   Lactic Acid, Venous 1.2 0.5 - 1.9 mmol/L    Comment: Performed at [facility], [address] [place], [place] [address]   CT ABDOMEN PELVIS W CONTRAST  Result Date: 10/25/2022 CLINICAL DATA:  Evaluate for bowel obstruction. EXAM: CT ABDOMEN AND PELVIS WITH CONTRAST TECHNIQUE: Multidetector CT imaging of the abdomen and pelvis was performed using the standard protocol following bolus administration of intravenous contrast. RADIATION DOSE REDUCTION: This exam was performed according to the departmental dose-optimization program which includes automated exposure control, adjustment of the mA and/or kV according to patient size and/or use of iterative reconstruction technique. CONTRAST:  OMNIPAQUE IOHEXOL 350 MG/ML SOLN COMPARISON:  07/12/2019 FINDINGS: Lower chest: No acute abnormality. Hepatobiliary: No suspicious liver abnormality. Small stones within the dependent portion of the gallbladder are identified measuring up to 5 mm. No gallbladder wall thickening or inflammation. Pancreas: Unremarkable. No pancreatic ductal dilatation or surrounding inflammatory changes. Spleen: Normal in size without focal abnormality. Adrenals/Urinary Tract: Adrenal glands are unremarkable. Kidneys are normal, without renal calculi, focal lesion, or hydronephrosis. Bladder is unremarkable. Stomach/Bowel: Small hiatal hernia. Stomach is nondistended. Multiple dilated loops of mid and distal small bowel are identified with scattered air-fluid levels. Small bowel loops measure up to 3.7 cm in diameter. Imaging findings are compatible with a small-bowel obstruction secondary to large anterior abdominal wall hernia. The transition to decreased caliber distal small bowel is noted within the left side of the hernia, image  73/2. There is fluid and soft tissue stranding surrounding the herniated bowel loops suggesting a degree of incarceration, similar in appearance to the previous exam. Normal caliber of the colon. The appendix is visualized and appears normal. Vascular/Lymphatic: Aortic atherosclerosis without aneurysm. No abdominopelvic adenopathy. Reproductive: Uterus and bilateral adnexa are unremarkable. Other: Free fluid identified within the ventral abdominal wall hernia. No focal fluid collections. No signs of pneumoperitoneum. Large ventral abdominal wall hernia as above. Musculoskeletal: No acute or suspicious osseous findings. IMPRESSION: 1. Small-bowel obstruction secondary to large ventral abdominal wall hernia. Transition to decreased caliber distal small bowel is noted within the left side of the hernia. There is fluid and soft tissue stranding surrounding the herniated bowel loops suggesting a degree of incarceration, similar in appearance to the previous exam. 2. Cholelithiasis without evidence of acute cholecystitis. 3. Aortic Atherosclerosis (ICD10-I70.0). Electronically Signed   By: Signa Kell M.D.   On: 10/25/2022 07:08    Pending Labs Unresulted Labs (From admission, onward)     Start     Ordered   10/26/22 0500  CBC  Tomorrow morning,   STAT        10/25/22 0754   10/26/22 0500  Basic metabolic panel  Tomorrow morning,   STAT        10/25/22 0754   10/25/22 0755  Hemoglobin A1c  Once,   R       Comments: To assess prior glycemic control    10/25/22 0755   10/25/22 0754  HIV Antibody (routine testing w rflx)  (HIV Antibody (Routine testing w reflex) panel)  Once,   R        10/25/22 0754            Vitals/Pain Today's Vitals   10/24/22 2154 10/25/22 0102 10/25/22 0700 10/25/22 0730  BP:  133/87  (!) 145/61  Pulse:  62 (!) 59 81  Resp:  16 16 20   Temp:  97.7 F (36.5 C) 98.5 F (36.9 C)   TempSrc:  Oral    SpO2:  100% 96% 100%  Weight: 285 lb (129.3 kg)     Height: 5' (1.524  m)     PainSc:        Isolation Precautions No active isolations  Medications Medications  hydrALAZINE (APRESOLINE) injection 5 mg (has no administration in time range)  pantoprazole (PROTONIX) injection 40 mg (has no administration in time range)  0.9 %  sodium chloride infusion (has no administration in time range)  HYDROmorphone (DILAUDID) injection 0.5 mg (has no administration in time range)  ondansetron (ZOFRAN) injection 4 mg (has no administration in time range)  enoxaparin (LOVENOX) injection 40 mg (has no administration in time range)  acetaminophen (TYLENOL) tablet 650 mg (has no administration in time range)    Or  acetaminophen (TYLENOL) suppository 650 mg (has no administration in time range)  insulin aspart (novoLOG) injection 0-20 Units (has no administration in time range)  insulin aspart (novoLOG) injection 0-5 Units (has no administration in time range)  morphine (PF) 4 MG/ML injection 4 mg (4 mg Intravenous Given 10/25/22 0534)  ondansetron (ZOFRAN) injection 4 mg (4 mg Intravenous Given 10/25/22 0530)  lactated ringers bolus 1,000 mL (1,000 mLs Intravenous New Bag/Given 10/25/22 0533)  iohexol (OMNIPAQUE) 350 MG/ML injection 100 mL (100 mLs Intravenous Contrast Given 10/25/22 0543)    Mobility walks     Focused Assessments     R Recommendations: See Admitting Provider Note  Report given to:   Additional Notes:

## 2022-10-26 ENCOUNTER — Inpatient Hospital Stay: Payer: BC Managed Care – PPO

## 2022-10-26 DIAGNOSIS — K56609 Unspecified intestinal obstruction, unspecified as to partial versus complete obstruction: Secondary | ICD-10-CM | POA: Diagnosis not present

## 2022-10-26 LAB — CBC
HCT: 37.7 % (ref 36.0–46.0)
Hemoglobin: 12.3 g/dL (ref 12.0–15.0)
MCH: 31.3 pg (ref 26.0–34.0)
MCHC: 32.6 g/dL (ref 30.0–36.0)
MCV: 95.9 fL (ref 80.0–100.0)
Platelets: 221 10*3/uL (ref 150–400)
RBC: 3.93 MIL/uL (ref 3.87–5.11)
RDW: 13.2 % (ref 11.5–15.5)
WBC: 7.9 10*3/uL (ref 4.0–10.5)
nRBC: 0 % (ref 0.0–0.2)

## 2022-10-26 LAB — BASIC METABOLIC PANEL
Anion gap: 10 (ref 5–15)
BUN: 22 mg/dL (ref 8–23)
CO2: 23 mmol/L (ref 22–32)
Calcium: 8.8 mg/dL — ABNORMAL LOW (ref 8.9–10.3)
Chloride: 103 mmol/L (ref 98–111)
Creatinine, Ser: 0.7 mg/dL (ref 0.44–1.00)
GFR, Estimated: >60 mL/min (ref >60)
Glucose, Bld: 232 mg/dL — ABNORMAL HIGH (ref 70–99)
Potassium: 4.1 mmol/L (ref 3.5–5.1)
Sodium: 136 mmol/L (ref 135–145)

## 2022-10-26 LAB — GLUCOSE, CAPILLARY
Glucose-Capillary: 218 mg/dL — ABNORMAL HIGH (ref 70–99)
Glucose-Capillary: 230 mg/dL — ABNORMAL HIGH (ref 70–99)
Glucose-Capillary: 178 mg/dL — ABNORMAL HIGH (ref 70–99)
Glucose-Capillary: 135 mg/dL — ABNORMAL HIGH (ref 70–99)
Glucose-Capillary: 148 mg/dL — ABNORMAL HIGH (ref 70–99)

## 2022-10-26 MED ORDER — LACTATED RINGERS IV SOLN: INTRAVENOUS | Status: AC

## 2022-10-26 NOTE — Progress Notes (Signed)
Transition of Care [facility]) - Inpatient Brief Assessment   Patient Details  Name: Holly Simon MRN: [id number] Date of Birth: 05/08/1960  Transition of Care [facility]) CM/SW Contact:    Darolyn Rua, LCSW Phone Number: 10/26/2022, 9:05 AM   Clinical Narrative:  Patient presents from home to ED with abdominal pain, nausea and vomiting with hx of bowel obstruction.  Patient confirmed dx of small bowel obstruction, NG tube placed and surgery consulted.   PCP Dr. Nemiah Commander, insurance BC[employer]  No current TOC needs please consult TOC should discharge planning needs arise.   Transition of Care Asessment: Insurance and Status: Insurance coverage has been reviewed Patient has primary care physician: Yes Home environment has been reviewed: from home Prior level of function:: independent Prior/Current Home Services: No current home services Social Determinants of Health Reivew: SDOH reviewed no interventions necessary Readmission risk has been reviewed: Yes Transition of care needs: no transition of care needs at this time

## 2022-10-26 NOTE — H&P (View-Only) (Signed)
Patient ID: Holly Simon, female   DOB: [birth year], 62 y.o.   MRN: [id number]     SURGICAL PROGRESS NOTE   Hospital Day(s): 1.   Interval History: Patient seen and examined, no acute events or new complaints overnight. Patient reports she was feeling nauseated this morning.  She was started feeling nauseated when she tried to get out of bed.  She also felt nauseated when trying to brush her teeth  Vital signs in last 24 hours: [min-max] current  Temp:  [98.1 F (36.7 C)-99.1 F (37.3 C)] 98.8 F (37.1 C) (09/25 0851) Pulse Rate:  [77-87] 87 (09/25 0851) Resp:  [16-18] 18 (09/25 0851) BP: (126-141)/(58-66) 141/66 (09/25 0851) SpO2:  [96 %-99 %] 97 % (09/25 0851)     Height: 5' (152.4 cm) Weight: 129.3 kg BMI (Calculated): 55.66   Physical Exam:  Constitutional: alert, cooperative and no distress  Respiratory: breathing non-labored at rest  Cardiovascular: regular rate and sinus rhythm  Gastrointestinal: soft, mild-tender, and mild-distended  Labs:     Latest Ref Rng & Units 10/26/2022    3:46 AM 10/24/2022    9:56 PM 07/14/2019    6:26 AM  CBC  WBC 4.0 - 10.5 K/uL 7.9  14.3  5.2   Hemoglobin 12.0 - 15.0 g/dL 40.9  81.1  91.4   Hematocrit 36.0 - 46.0 % 37.7  41.7  32.2   Platelets 150 - 400 K/uL 221  196  173       Latest Ref Rng & Units 10/26/2022    3:46 AM 10/25/2022    1:05 AM 10/24/2022    9:56 PM  CMP  Glucose 70 - 99 mg/dL 782   956   BUN 8 - 23 mg/dL 22  22  QUANTITY NOT SUFFICIENT, UNABLE TO PERFORM TEST   Creatinine 0.44 - 1.00 mg/dL 2.13   0.86   Sodium 578 - 145 mmol/L 136   134   Potassium 3.5 - 5.1 mmol/L 4.1   4.3   Chloride 98 - 111 mmol/L 103   100   CO2 22 - 32 mmol/L 23   20   Calcium 8.9 - 10.3 mg/dL 8.8   9.3   Total Protein 6.5 - 8.1 g/dL   7.1   Total Bilirubin 0.3 - 1.2 mg/dL   0.8   Alkaline Phos 38 - 126 U/L   62   AST 15 - 41 U/L   19   ALT 0 - 44 U/L   17     Imaging studies: No new pertinent imaging studies   Assessment/Plan:  62 y.o.  female with small bowel obstruction, complicated by pertinent comorbidities including recurrent ventral hernia, morbid obesity, type 2 diabetes, hypertension.   -Abdominal x-ray showed persistent small bowel dilation -Gastrografin not reaching the large intestine -Discussed with the patient that she has not had any improvement, continue nauseated, will recommend to proceed with repair of ventral hernia to resolve the obstruction -Discussed with patient that we will try a minimal invasive approach we will there is a chance of converting to open. -Discussed with patient risk of surgery including bleeding, infection, injury to adjacent organ, injury to bowel, enterocutaneous fistula, pain, among others.   -- Patient also oriented about the high risk of surgery due to morbid obesity.  Patient oriented about the very high risk of recurrence of the hernia.  I think that regardless there is higher risk due to the recurrent obstruction and no resolution of the obstruction at this time  patient still benefit of the surgery.  Gae Gallop, MD

## 2022-10-26 NOTE — Plan of Care (Signed)

## 2022-10-26 NOTE — Progress Notes (Signed)
Patient ID: Holly Simon, female   DOB: [birth year], 62 y.o.   MRN: [id number]     SURGICAL PROGRESS NOTE   Hospital Day(s): 1.   Interval History: Patient seen and examined, no acute events or new complaints overnight. Patient reports she was feeling nauseated this morning.  She was started feeling nauseated when she tried to get out of bed.  She also felt nauseated when trying to brush her teeth  Vital signs in last 24 hours: [min-max] current  Temp:  [98.1 F (36.7 C)-99.1 F (37.3 C)] 98.8 F (37.1 C) (09/25 0851) Pulse Rate:  [77-87] 87 (09/25 0851) Resp:  [16-18] 18 (09/25 0851) BP: (126-141)/(58-66) 141/66 (09/25 0851) SpO2:  [96 %-99 %] 97 % (09/25 0851)     Height: 5' (152.4 cm) Weight: 129.3 kg BMI (Calculated): 55.66   Physical Exam:  Constitutional: alert, cooperative and no distress  Respiratory: breathing non-labored at rest  Cardiovascular: regular rate and sinus rhythm  Gastrointestinal: soft, mild-tender, and mild-distended  Labs:     Latest Ref Rng & Units 10/26/2022    3:46 AM 10/24/2022    9:56 PM 07/14/2019    6:26 AM  CBC  WBC 4.0 - 10.5 K/uL 7.9  14.3  5.2   Hemoglobin 12.0 - 15.0 g/dL 40.9  81.1  91.4   Hematocrit 36.0 - 46.0 % 37.7  41.7  32.2   Platelets 150 - 400 K/uL 221  196  173       Latest Ref Rng & Units 10/26/2022    3:46 AM 10/25/2022    1:05 AM 10/24/2022    9:56 PM  CMP  Glucose 70 - 99 mg/dL 782   956   BUN 8 - 23 mg/dL 22  22  QUANTITY NOT SUFFICIENT, UNABLE TO PERFORM TEST   Creatinine 0.44 - 1.00 mg/dL 2.13   0.86   Sodium 578 - 145 mmol/L 136   134   Potassium 3.5 - 5.1 mmol/L 4.1   4.3   Chloride 98 - 111 mmol/L 103   100   CO2 22 - 32 mmol/L 23   20   Calcium 8.9 - 10.3 mg/dL 8.8   9.3   Total Protein 6.5 - 8.1 g/dL   7.1   Total Bilirubin 0.3 - 1.2 mg/dL   0.8   Alkaline Phos 38 - 126 U/L   62   AST 15 - 41 U/L   19   ALT 0 - 44 U/L   17     Imaging studies: No new pertinent imaging studies   Assessment/Plan:  62 y.o.  female with small bowel obstruction, complicated by pertinent comorbidities including recurrent ventral hernia, morbid obesity, type 2 diabetes, hypertension.   -Abdominal x-ray showed persistent small bowel dilation -Gastrografin not reaching the large intestine -Discussed with the patient that she has not had any improvement, continue nauseated, will recommend to proceed with repair of ventral hernia to resolve the obstruction -Discussed with patient that we will try a minimal invasive approach we will there is a chance of converting to open. -Discussed with patient risk of surgery including bleeding, infection, injury to adjacent organ, injury to bowel, enterocutaneous fistula, pain, among others.   -- Patient also oriented about the high risk of surgery due to morbid obesity.  Patient oriented about the very high risk of recurrence of the hernia.  I think that regardless there is higher risk due to the recurrent obstruction and no resolution of the obstruction at this time  patient still benefit of the surgery.  Gae Gallop, MD

## 2022-10-26 NOTE — Inpatient Diabetes Management (Signed)
Inpatient Diabetes Program Recommendations  AACE/ADA: New Consensus Statement on Inpatient Glycemic Control  Target Ranges:  Prepandial:   less than 140 mg/dL      Peak postprandial:   less than 180 mg/dL (1-2 hours)      Critically ill patients:  140 - 180 mg/dL    Latest Reference Range & Units 10/25/22 08:58 10/25/22 12:01 10/25/22 16:23 10/25/22 20:36 10/26/22 08:50 10/26/22 09:17  Glucose-Capillary 70 - 99 mg/dL 409 (H)  Novolog 7 units 208 (H)  Novolog 7 units 108 (H) 158 (H) 218 (H) 230 (H)  Novolog 7 units   Review of Glycemic Control  Diabetes history: DM2 Outpatient Diabetes medications: Metformin 500 mg BID, Actos 45 mg daily, Trulicity 1.5 mg Qweek Current orders for Inpatient glycemic control: Novolog 0-20 units TID with meals, Novolog 0-5 units QHS   Inpatient Diabetes Program Recommendations:     Insulin: Please consider ordering Semglee 7 units Q24H.   Thanks, Orlando Penner, RN, MSN, CDCES Diabetes Coordinator Inpatient Diabetes Program [phone] (Team Pager from 8am to 5pm)

## 2022-10-26 NOTE — Progress Notes (Addendum)
Progress Note    Holly Simon  ATF:[id number] DOB: August 22, 1960  DOA: 10/25/2022 PCP: Enid Baas, MD      Brief Narrative:    Medical records reviewed and are as summarized below:  Holly Simon is a 62 y.o. female  with medical history significant of large ventral wall hernia, recurrent SBO, HTN, IIDM, morbid obesity, who presented to the hospital with abdominal pain and constipation. She had seen a surgeon in the past for evaluation for ventral hernia repair.  However, she was told that she has to lose some weight before surgery will be considered.        Assessment/Plan:   Principal Problem:   SBO (small bowel obstruction) (HCC) Active Problems:   Small bowel obstruction (HCC)   Obesity, Class III, BMI 40-49.9 (morbid obesity) (HCC)   Body mass index is 55.66 kg/m.  (Morbid obesity): This complicates overall care and prognosis.   Small bowel obstruction, large incarcerated ventral hernia: Continue bowel decompression with nasogastric tube.  Analgesics as needed for pain.  Antiemetics as needed.  She is NPO.  Restart IV fluids for hydration.  She has been evaluated by the general surgeon who is considering surgical intervention. Leukocytosis: Improved.  Likely reactive.   Type II DM: Metformin, glimepiride and pioglitazone been held.  Continue NovoLog sliding scale as needed.   Hypertension: Lisinopril on hold.  IV hydralazine as needed for severe hypertension.   Abnormal EKG, sinus bradycardia with nonspecific ST-T changes: Troponins negative.  No chest pain.  Diet Order             Diet NPO time specified Except for: Sips with Meds  Diet effective now                            Consultants: General surgeon  Procedures: None    Medications:    enoxaparin (LOVENOX) injection  0.5 mg/kg Subcutaneous Q24H   insulin aspart  0-20 Units Subcutaneous TID WC   insulin aspart  0-5 Units Subcutaneous QHS   latanoprost  1 drop Both  Eyes QHS   pantoprazole (PROTONIX) IV  40 mg Intravenous Q24H   Continuous Infusions:  lactated ringers 100 mL/hr at 10/26/22 0854     Anti-infectives (From admission, onward)    None              Family Communication/Anticipated D/C date and plan/Code Status   DVT prophylaxis:      Code Status: Full Code  Family Communication: None Disposition Plan: Plan to discharge home   Status is: Inpatient Remains inpatient appropriate because: Small bowel obstruction       Subjective:   Interval events noted.  She complains of nausea, vomiting and abdominal soreness.  She vomited once this morning when she was brushing her teeth.  Objective:    Vitals:   10/25/22 1455 10/25/22 1958 10/26/22 0452 10/26/22 0851  BP: (!) 132/58 (!) 126/58 135/61 (!) 141/66  Pulse: 77 85 83 87  Resp: 18 16  18   Temp: 98.9 F (37.2 C) 99.1 F (37.3 C) 98.1 F (36.7 C) 98.8 F (37.1 C)  TempSrc: Oral  Oral Oral  SpO2: 99% 97% 96% 97%  Weight:      Height:       No data found.   Intake/Output Summary (Last 24 hours) at 10/26/2022 1131 Last data filed at 10/26/2022 0926 Gross per 24 hour  Intake 90 ml  Output 250  ml  Net -160 ml   Filed Weights   10/24/22 2154  Weight: 129.3 kg    Exam:  GEN: NAD SKIN: Warm and dry EYES: No pallor or icterus ENT: MMM.  NG tube in place with greenish fluid in the canister. CV: RRR PULM: CTA B ABD: soft, obese, upper abdominal tenderness without rebound tenderness or guarding, +BS CNS: AAO x 3, non focal EXT: No edema or tenderness        Data Reviewed:   I have personally reviewed following labs and imaging studies:  Labs: Labs show the following:   Basic Metabolic Panel: Recent Labs  Lab 10/24/22 2156 10/25/22 0105 10/26/22 0346  NA 134*  --  136  K 4.3  --  4.1  CL 100  --  103  CO2 20*  --  23  GLUCOSE 221*  --  232*  BUN QUANTITY NOT SUFFICIENT, UNABLE TO PERFORM TEST 22 22  CREATININE 0.83  --  0.70   CALCIUM 9.3  --  8.8*   GFR Estimated Creatinine Clearance: 88.6 mL/min (by C-G formula based on SCr of 0.7 mg/dL). Liver Function Tests: Recent Labs  Lab 10/24/22 2156  AST 19  ALT 17  ALKPHOS 62  BILITOT 0.8  PROT 7.1  ALBUMIN 3.8   Recent Labs  Lab 10/24/22 2156  LIPASE 29   No results for input(s): "AMMONIA" in the last 168 hours. Coagulation profile No results for input(s): "INR", "PROTIME" in the last 168 hours.  CBC: Recent Labs  Lab 10/24/22 2156 10/26/22 0346  WBC 14.3* 7.9  HGB 12.9 12.3  HCT 41.7 37.7  MCV 102.0* 95.9  PLT 196 221   Cardiac Enzymes: No results for input(s): "CKTOTAL", "CKMB", "CKMBINDEX", "TROPONINI" in the last 168 hours. BNP (last 3 results) No results for input(s): "PROBNP" in the last 8760 hours. CBG: Recent Labs  Lab 10/25/22 1201 10/25/22 1623 10/25/22 2036 10/26/22 0850 10/26/22 0917  GLUCAP 208* 108* 158* 218* 230*   D-Dimer: No results for input(s): "DDIMER" in the last 72 hours. Hgb A1c: Recent Labs    10/25/22 0856  HGBA1C 6.6*   Lipid Profile: No results for input(s): "CHOL", "HDL", "LDLCALC", "TRIG", "CHOLHDL", "LDLDIRECT" in the last 72 hours. Thyroid function studies: No results for input(s): "TSH", "T4TOTAL", "T3FREE", "THYROIDAB" in the last 72 hours.  Invalid input(s): "FREET3" Anemia work up: No results for input(s): "VITAMINB12", "FOLATE", "FERRITIN", "TIBC", "IRON", "RETICCTPCT" in the last 72 hours. Sepsis Labs: Recent Labs  Lab 10/24/22 2156 10/25/22 0521 10/26/22 0346  WBC 14.3*  --  7.9  LATICACIDVEN  --  1.2  --     Microbiology No results found for this or any previous visit (from the past 240 hour(s)).  Procedures and diagnostic studies:  DG Abd Portable 1V-Small Bowel Obstruction Protocol-initial, 8 hr delay  Result Date: 10/26/2022 CLINICAL DATA:  Small-bowel obstruction, EXAM: PORTABLE ABDOMEN - 1 VIEW COMPARISON:  None Available. FINDINGS: Normal radiographs are obtained 8  hours following nasogastric administration of oral contrast. Contrast is seen filling multiple dilated loops of small bowel in keeping with a mid to distal small bowel obstruction. There is no definite extension of contrast into ascending colon. No free intraperitoneal gas. No organomegaly. Osseous structures are age-appropriate. IMPRESSION: 1. Persistent mid to distal small bowel obstruction. Electronically Signed   By: Helyn Numbers M.D.   On: 10/26/2022 01:44   DG Abdomen 1 View  Result Date: 10/25/2022 CLINICAL DATA:  Post NG placement EXAM: ABDOMEN - 1 VIEW  COMPARISON:  07/14/2019. FINDINGS: The bowel gas pattern is non-obstructive. No evidence of pneumoperitoneum. No acute osseous abnormalities. Visualized bilateral lungs are within normal limits. No consolidation, lung mass or pleural effusion. Surgical changes, devices, tubes and lines: Interval placement of nasogastric tube with its tip and side hole overlying the right upper abdomen, within the distal stomach. IMPRESSION: Interval placement of nasogastric tube with its tip and side hole overlying the right upper abdomen, within the distal stomach. Electronically Signed   By: Jules Schick M.D.   On: 10/25/2022 10:54   CT ABDOMEN PELVIS W CONTRAST  Result Date: 10/25/2022 CLINICAL DATA:  Evaluate for bowel obstruction. EXAM: CT ABDOMEN AND PELVIS WITH CONTRAST TECHNIQUE: Multidetector CT imaging of the abdomen and pelvis was performed using the standard protocol following bolus administration of intravenous contrast. RADIATION DOSE REDUCTION: This exam was performed according to the departmental dose-optimization program which includes automated exposure control, adjustment of the mA and/or kV according to patient size and/or use of iterative reconstruction technique. CONTRAST:  OMNIPAQUE IOHEXOL 350 MG/ML SOLN COMPARISON:  07/12/2019 FINDINGS: Lower chest: No acute abnormality. Hepatobiliary: No suspicious liver abnormality. Small stones  within the dependent portion of the gallbladder are identified measuring up to 5 mm. No gallbladder wall thickening or inflammation. Pancreas: Unremarkable. No pancreatic ductal dilatation or surrounding inflammatory changes. Spleen: Normal in size without focal abnormality. Adrenals/Urinary Tract: Adrenal glands are unremarkable. Kidneys are normal, without renal calculi, focal lesion, or hydronephrosis. Bladder is unremarkable. Stomach/Bowel: Small hiatal hernia. Stomach is nondistended. Multiple dilated loops of mid and distal small bowel are identified with scattered air-fluid levels. Small bowel loops measure up to 3.7 cm in diameter. Imaging findings are compatible with a small-bowel obstruction secondary to large anterior abdominal wall hernia. The transition to decreased caliber distal small bowel is noted within the left side of the hernia, image 73/2. There is fluid and soft tissue stranding surrounding the herniated bowel loops suggesting a degree of incarceration, similar in appearance to the previous exam. Normal caliber of the colon. The appendix is visualized and appears normal. Vascular/Lymphatic: Aortic atherosclerosis without aneurysm. No abdominopelvic adenopathy. Reproductive: Uterus and bilateral adnexa are unremarkable. Other: Free fluid identified within the ventral abdominal wall hernia. No focal fluid collections. No signs of pneumoperitoneum. Large ventral abdominal wall hernia as above. Musculoskeletal: No acute or suspicious osseous findings. IMPRESSION: 1. Small-bowel obstruction secondary to large ventral abdominal wall hernia. Transition to decreased caliber distal small bowel is noted within the left side of the hernia. There is fluid and soft tissue stranding surrounding the herniated bowel loops suggesting a degree of incarceration, similar in appearance to the previous exam. 2. Cholelithiasis without evidence of acute cholecystitis. 3. Aortic Atherosclerosis (ICD10-I70.0).  Electronically Signed   By: Signa Kell M.D.   On: 10/25/2022 07:08               LOS: 1 day   Holly Simon  [facility]   Pager on [email or web address]. If 7PM-7AM, please contact night-coverage at [email or web address]     10/26/2022, 11:31 AM

## 2022-10-27 ENCOUNTER — Encounter: Payer: Self-pay | Admitting: Internal Medicine

## 2022-10-27 ENCOUNTER — Other Ambulatory Visit: Payer: Self-pay

## 2022-10-27 ENCOUNTER — Encounter
Admission: EM | Disposition: A | Discharge status: Home / Self Care | Payer: Self-pay | Source: Home / Self Care | Attending: Internal Medicine

## 2022-10-27 ENCOUNTER — Inpatient Hospital Stay: Payer: BC Managed Care – PPO | Admitting: Anesthesiology

## 2022-10-27 DIAGNOSIS — K56609 Unspecified intestinal obstruction, unspecified as to partial versus complete obstruction: Secondary | ICD-10-CM | POA: Diagnosis not present

## 2022-10-27 HISTORY — PX: VENTRAL HERNIA REPAIR: SHX424

## 2022-10-27 HISTORY — PX: XI ROBOTIC ASSISTED VENTRAL HERNIA: SHX6789

## 2022-10-27 LAB — BASIC METABOLIC PANEL
Anion gap: 13 (ref 5–15)
BUN: 27 mg/dL — ABNORMAL HIGH (ref 8–23)
CO2: 24 mmol/L (ref 22–32)
Calcium: 8.7 mg/dL — ABNORMAL LOW (ref 8.9–10.3)
Chloride: 105 mmol/L (ref 98–111)
Creatinine, Ser: 0.66 mg/dL (ref 0.44–1.00)
GFR, Estimated: >60 mL/min (ref >60)
Glucose, Bld: 186 mg/dL — ABNORMAL HIGH (ref 70–99)
Potassium: 4.1 mmol/L (ref 3.5–5.1)
Sodium: 142 mmol/L (ref 135–145)

## 2022-10-27 LAB — GLUCOSE, CAPILLARY
Glucose-Capillary: 174 mg/dL — ABNORMAL HIGH (ref 70–99)
Glucose-Capillary: 142 mg/dL — ABNORMAL HIGH (ref 70–99)
Glucose-Capillary: 143 mg/dL — ABNORMAL HIGH (ref 70–99)
Glucose-Capillary: 180 mg/dL — ABNORMAL HIGH (ref 70–99)
Glucose-Capillary: 195 mg/dL — ABNORMAL HIGH (ref 70–99)

## 2022-10-27 LAB — PHOSPHORUS: Phosphorus: 2.6 mg/dL (ref 2.5–4.6)

## 2022-10-27 LAB — MAGNESIUM: Magnesium: 1.8 mg/dL (ref 1.7–2.4)

## 2022-10-27 SURGERY — REPAIR, HERNIA, VENTRAL, ROBOT-ASSISTED
Anesthesia: General

## 2022-10-27 MED ORDER — HYDROMORPHONE HCL 1 MG/ML IJ SOLN
INTRAMUSCULAR | Status: DC | PRN
Start: 2022-10-27 — End: 2022-10-27
  Administered 2022-10-27: 1 mg via INTRAVENOUS

## 2022-10-27 MED ORDER — SODIUM CHLORIDE 0.9 % IV SOLN
INTRAVENOUS | Status: DC | PRN
Start: 2022-10-27 — End: 2022-10-27

## 2022-10-27 MED ORDER — ACETAMINOPHEN 10 MG/ML IV SOLN
INTRAVENOUS | Status: AC
  Filled 2022-10-27: qty 100

## 2022-10-27 MED ORDER — CEFAZOLIN SODIUM-DEXTROSE 2-3 GM-%(50ML) IV SOLR
INTRAVENOUS | Status: DC | PRN
Start: 2022-10-27 — End: 2022-10-27
  Administered 2022-10-27: 2 g via INTRAVENOUS
  Administered 2022-10-27: 1 g via INTRAVENOUS
  Administered 2022-10-27: 3 g via INTRAVENOUS

## 2022-10-27 MED ORDER — FENTANYL CITRATE (PF) 100 MCG/2ML IJ SOLN
INTRAMUSCULAR | Status: AC
  Filled 2022-10-27: qty 2

## 2022-10-27 MED ORDER — GLYCOPYRROLATE 0.2 MG/ML IJ SOLN
INTRAMUSCULAR | Status: DC | PRN
  Administered 2022-10-27: .2 mg via INTRAVENOUS

## 2022-10-27 MED ORDER — ROCURONIUM BROMIDE 100 MG/10ML IV SOLN
INTRAVENOUS | Status: DC | PRN
  Administered 2022-10-27: 30 mg via INTRAVENOUS
  Administered 2022-10-27: 50 mg via INTRAVENOUS
  Administered 2022-10-27: 20 mg via INTRAVENOUS
  Administered 2022-10-27: 30 mg via INTRAVENOUS
  Administered 2022-10-27: 20 mg via INTRAVENOUS

## 2022-10-27 MED ORDER — DROPERIDOL 2.5 MG/ML IJ SOLN: 0.6250 mg | Freq: Once | INTRAMUSCULAR | Status: DC | PRN

## 2022-10-27 MED ORDER — CEFAZOLIN SODIUM-DEXTROSE 2-4 GM/100ML-% IV SOLN
INTRAVENOUS | Status: AC
  Filled 2022-10-27: qty 100

## 2022-10-27 MED ORDER — SUCCINYLCHOLINE CHLORIDE 200 MG/10ML IV SOSY
PREFILLED_SYRINGE | INTRAVENOUS | Status: DC | PRN
  Administered 2022-10-27: 140 mg via INTRAVENOUS

## 2022-10-27 MED ORDER — IRRISEPT - 450ML BOTTLE WITH 0.05% CHG IN STERILE WATER, USP 99.95% OPTIME
TOPICAL | Status: DC | PRN
Start: 2022-10-27 — End: 2022-10-27
  Administered 2022-10-27: 450 mL

## 2022-10-27 MED ORDER — BUPIVACAINE LIPOSOME 1.3 % IJ SUSP
INTRAMUSCULAR | Status: DC | PRN
Start: 2022-10-27 — End: 2022-10-27
  Administered 2022-10-27: 20 mL

## 2022-10-27 MED ORDER — HYDROMORPHONE HCL 1 MG/ML IJ SOLN: 0.2500 mg | INTRAMUSCULAR | Status: DC | PRN

## 2022-10-27 MED ORDER — PHENYLEPHRINE 80 MCG/ML (10ML) SYRINGE FOR IV PUSH (FOR BLOOD PRESSURE SUPPORT)
PREFILLED_SYRINGE | INTRAVENOUS | Status: DC | PRN
  Administered 2022-10-27: 160 ug via INTRAVENOUS

## 2022-10-27 MED ORDER — PROPOFOL 10 MG/ML IV BOLUS
INTRAVENOUS | Status: DC | PRN
  Administered 2022-10-27: 100 mg via INTRAVENOUS

## 2022-10-27 MED ORDER — MIDAZOLAM HCL 2 MG/2ML IJ SOLN
INTRAMUSCULAR | Status: AC
  Filled 2022-10-27: qty 2

## 2022-10-27 MED ORDER — BUPIVACAINE LIPOSOME 1.3 % IJ SUSP
INTRAMUSCULAR | Status: AC
  Filled 2022-10-27: qty 20

## 2022-10-27 MED ORDER — SODIUM CHLORIDE 0.9 % IV SOLN: INTRAVENOUS | Status: DC

## 2022-10-27 MED ORDER — PHENYLEPHRINE HCL-NACL 20-0.9 MG/250ML-% IV SOLN
INTRAVENOUS | Status: AC
  Filled 2022-10-27: qty 250

## 2022-10-27 MED ORDER — MORPHINE SULFATE (PF) 4 MG/ML IV SOLN: 4.0000 mg | INTRAVENOUS | Status: DC | PRN

## 2022-10-27 MED ORDER — CHLORHEXIDINE GLUCONATE 0.12 % MT SOLN: 15.0000 mL | Freq: Once | OROMUCOSAL | Status: DC

## 2022-10-27 MED ORDER — HYDROMORPHONE HCL 1 MG/ML IJ SOLN
INTRAMUSCULAR | Status: AC
  Filled 2022-10-27: qty 1

## 2022-10-27 MED ORDER — FENTANYL CITRATE (PF) 100 MCG/2ML IJ SOLN
25.0000 ug | INTRAMUSCULAR | Status: DC | PRN
  Administered 2022-10-27 (×3): 50 ug via INTRAVENOUS

## 2022-10-27 MED ORDER — GABAPENTIN 300 MG PO CAPS
300.0000 mg | ORAL_CAPSULE | Freq: Three times a day (TID) | ORAL | Status: DC
  Administered 2022-10-27 – 2022-11-01 (×14): 300 mg via ORAL
  Filled 2022-10-27 (×14): qty 1

## 2022-10-27 MED ORDER — SODIUM CHLORIDE FLUSH 0.9 % IV SOLN
INTRAVENOUS | Status: AC
  Filled 2022-10-27: qty 30

## 2022-10-27 MED ORDER — FENTANYL CITRATE (PF) 100 MCG/2ML IJ SOLN
INTRAMUSCULAR | Status: DC | PRN
  Administered 2022-10-27 (×4): 50 ug via INTRAVENOUS
  Administered 2022-10-27: 100 ug via INTRAVENOUS

## 2022-10-27 MED ORDER — PHENYLEPHRINE HCL-NACL 20-0.9 MG/250ML-% IV SOLN
INTRAVENOUS | Status: DC | PRN
  Administered 2022-10-27: 25 ug/min via INTRAVENOUS

## 2022-10-27 MED ORDER — ORAL CARE MOUTH RINSE: 15.0000 mL | Freq: Once | OROMUCOSAL | Status: DC

## 2022-10-27 MED ORDER — MIDAZOLAM HCL 2 MG/2ML IJ SOLN
INTRAMUSCULAR | Status: DC | PRN
  Administered 2022-10-27: 1 mg via INTRAVENOUS

## 2022-10-27 MED ORDER — BUPIVACAINE-EPINEPHRINE (PF) 0.25% -1:200000 IJ SOLN
INTRAMUSCULAR | Status: DC | PRN
  Administered 2022-10-27: 30 mL

## 2022-10-27 MED ORDER — SUGAMMADEX SODIUM 200 MG/2ML IV SOLN
INTRAVENOUS | Status: DC | PRN
  Administered 2022-10-27: 260 mg via INTRAVENOUS

## 2022-10-27 MED ORDER — KETOROLAC TROMETHAMINE 30 MG/ML IJ SOLN
30.0000 mg | Freq: Three times a day (TID) | INTRAMUSCULAR | Status: DC
  Administered 2022-10-27 – 2022-11-01 (×14): 30 mg via INTRAVENOUS
  Filled 2022-10-27 (×12): qty 1

## 2022-10-27 MED ORDER — KETOROLAC TROMETHAMINE 15 MG/ML IJ SOLN
30.0000 mg | Freq: Once | INTRAMUSCULAR | Status: AC
  Administered 2022-10-27: 30 mg via INTRAVENOUS

## 2022-10-27 MED ORDER — HYDROMORPHONE HCL 1 MG/ML IJ SOLN
0.2500 mg | INTRAMUSCULAR | Status: DC | PRN
  Administered 2022-10-27 (×4): 0.5 mg via INTRAVENOUS

## 2022-10-27 MED ORDER — KETOROLAC TROMETHAMINE 30 MG/ML IJ SOLN
INTRAMUSCULAR | Status: AC
  Filled 2022-10-27: qty 1

## 2022-10-27 MED ORDER — BUPIVACAINE-EPINEPHRINE (PF) 0.25% -1:200000 IJ SOLN
INTRAMUSCULAR | Status: AC
  Filled 2022-10-27: qty 30

## 2022-10-27 MED ORDER — LIDOCAINE HCL (CARDIAC) PF 100 MG/5ML IV SOSY
PREFILLED_SYRINGE | INTRAVENOUS | Status: DC | PRN
  Administered 2022-10-27: 100 mg via INTRAVENOUS

## 2022-10-27 MED ORDER — ONDANSETRON HCL 4 MG/2ML IJ SOLN
INTRAMUSCULAR | Status: DC | PRN
  Administered 2022-10-27: 4 mg via INTRAVENOUS

## 2022-10-27 MED ORDER — HYDROMORPHONE HCL 1 MG/ML IJ SOLN
INTRAMUSCULAR | Status: AC
  Filled 2022-10-27: qty 2

## 2022-10-27 MED ORDER — ACETAMINOPHEN 10 MG/ML IV SOLN
INTRAVENOUS | Status: DC | PRN
  Administered 2022-10-27: 1000 mg via INTRAVENOUS

## 2022-10-27 SURGICAL SUPPLY — 86 items
ADH SKN CLS APL DERMABOND .7 (GAUZE/BANDAGES/DRESSINGS) ×1
APL PRP STRL LF DISP 70% ISPRP (MISCELLANEOUS) ×1
BAG PRESSURE INF REUSE 1000 (BAG) IMPLANT
BLADE SURG 15 STRL LF DISP TIS (BLADE) ×1 IMPLANT
BLADE SURG 15 STRL SS (BLADE) ×1
BLADE SURG SZ11 CARB STEEL (BLADE) ×1 IMPLANT
CHLORAPREP W/TINT 26 (MISCELLANEOUS) ×1 IMPLANT
COVER TIP SHEARS 8 DVNC (MISCELLANEOUS) ×1 IMPLANT
COVER WAND RF STERILE (DRAPES) ×1 IMPLANT
DERMABOND ADVANCED .7 DNX12 (GAUZE/BANDAGES/DRESSINGS) ×1 IMPLANT
DRAPE ARM DVNC X/XI (DISPOSABLE) ×3 IMPLANT
DRAPE COLUMN DVNC XI (DISPOSABLE) ×1 IMPLANT
DRAPE LAPAROTOMY 100X77 ABD (DRAPES) ×1 IMPLANT
DRAPE SHEET LG 3/4 BI-LAMINATE (DRAPES) IMPLANT
DRESSING PEEL AND PLC PRVNA 13 (GAUZE/BANDAGES/DRESSINGS) IMPLANT
DRSG OPSITE POSTOP 3X4 (GAUZE/BANDAGES/DRESSINGS) IMPLANT
DRSG OPSITE POSTOP 4X10 (GAUZE/BANDAGES/DRESSINGS) IMPLANT
DRSG OPSITE POSTOP 4X8 (GAUZE/BANDAGES/DRESSINGS) IMPLANT
DRSG PEEL AND PLACE PREVENA 13 (GAUZE/BANDAGES/DRESSINGS) ×1
ELECT BLADE 6.5 EXT (BLADE) IMPLANT
ELECT CAUTERY BLADE 6.4 (BLADE) ×1 IMPLANT
ELECT EZSTD 165MM 6.5IN (MISCELLANEOUS)
ELECT REM PT RETURN 9FT ADLT (ELECTROSURGICAL) ×1
ELECTRODE EZSTD 165MM 6.5IN (MISCELLANEOUS) IMPLANT
ELECTRODE REM PT RTRN 9FT ADLT (ELECTROSURGICAL) ×1 IMPLANT
FORCEPS BPLR R/ABLATION 8 DVNC (INSTRUMENTS) ×1 IMPLANT
GLOVE BIO SURGEON STRL SZ 6.5 (GLOVE) ×2 IMPLANT
GLOVE BIOGEL PI IND STRL 6.5 (GLOVE) ×2 IMPLANT
GOWN STRL REUS W/ TWL LRG LVL3 (GOWN DISPOSABLE) ×3 IMPLANT
GOWN STRL REUS W/TWL LRG LVL3 (GOWN DISPOSABLE) ×3
HANDLE YANKAUER SUCT BULB TIP (MISCELLANEOUS) IMPLANT
IRRIGATOR SUCT 8 DISP DVNC XI (IRRIGATION / IRRIGATOR) IMPLANT
IV CATH ANGIO 12GX3 LT BLUE (NEEDLE) IMPLANT
IV NS 1000ML (IV SOLUTION)
IV NS 1000ML BAXH (IV SOLUTION) IMPLANT
JET LAVAGE IRRISEPT WOUND (IRRIGATION / IRRIGATOR) ×1
KIT PINK PAD W/HEAD ARE REST (MISCELLANEOUS) ×1
KIT PINK PAD W/HEAD ARM REST (MISCELLANEOUS) ×1 IMPLANT
KIT TURNOVER KIT A (KITS) ×1 IMPLANT
LABEL OR SOLS (LABEL) ×1 IMPLANT
LAVAGE JET IRRISEPT WOUND (IRRIGATION / IRRIGATOR) IMPLANT
LIGASURE IMPACT 36 18CM CVD LR (INSTRUMENTS) IMPLANT
MANIFOLD NEPTUNE II (INSTRUMENTS) ×1 IMPLANT
MESH PHASIX 6X8 POSITION DEVC (Mesh General) IMPLANT
MESH PROGRIP HERNIA FLAT 15X15 (Mesh General) IMPLANT
MESH PROGRIP LAP SELF FIXATING (Mesh General) ×1 IMPLANT
MESH VENTRALEX ST 1-7/10 CRC S (Mesh General) IMPLANT
MESH VENTRALEX ST 2.5 CRC MED (Mesh General) IMPLANT
MESH VENTRALEX ST 8CM LRG (Mesh General) IMPLANT
MESH VENTRALIGHT ST 4.5IN (Mesh General) IMPLANT
MESH VENTRALIGHT ST 4X6IN (Mesh General) IMPLANT
NDL DRIVE SUT CUT DVNC (INSTRUMENTS) ×1 IMPLANT
NDL HYPO 22X1.5 SAFETY MO (MISCELLANEOUS) ×1 IMPLANT
NDL INSUFFLATION 14GA 120MM (NEEDLE) ×1 IMPLANT
NEEDLE DRIVE SUT CUT DVNC (INSTRUMENTS) ×1 IMPLANT
NEEDLE HYPO 22X1.5 SAFETY MO (MISCELLANEOUS) ×1 IMPLANT
NEEDLE INSUFFLATION 14GA 120MM (NEEDLE) ×1 IMPLANT
NS IRRIG 500ML POUR BTL (IV SOLUTION) ×1 IMPLANT
OBTURATOR OPTICAL STND 8 DVNC (TROCAR) ×1
OBTURATOR OPTICALSTD 8 DVNC (TROCAR) ×1 IMPLANT
PACK BASIN MINOR ARMC (MISCELLANEOUS) ×1 IMPLANT
PACK LAP CHOLECYSTECTOMY (MISCELLANEOUS) ×1 IMPLANT
PDS ethicon IMPLANT
PENCIL SMOKE EVACUATOR (MISCELLANEOUS) IMPLANT
SCISSORS MNPLR CVD DVNC XI (INSTRUMENTS) ×1 IMPLANT
SEAL UNIV 5-12 XI (MISCELLANEOUS) ×3 IMPLANT
SET TUBE SMOKE EVAC HIGH FLOW (TUBING) ×1 IMPLANT
SOL ELECTROSURG ANTI STICK (MISCELLANEOUS) ×1
SOLUTION ELECTROSURG ANTI STCK (MISCELLANEOUS) ×1 IMPLANT
SPONGE KITTNER 5P (MISCELLANEOUS) IMPLANT
SPONGE T-LAP 18X18 ~~LOC~~+RFID (SPONGE) ×1 IMPLANT
STAPLER SKIN PROX 35W (STAPLE) IMPLANT
SUT MNCRL 4-0 (SUTURE) ×1
SUT MNCRL 4-0 27XMFL (SUTURE) ×1
SUT PDS AB 0 CT1 27 (SUTURE) IMPLANT
SUT PDS PLUS AB 0 CT-2 (SUTURE) ×1 IMPLANT
SUT PROLENE 0 CT 2 (SUTURE) ×2 IMPLANT
SUT STRATAFIX PDS 30 CT-1 (SUTURE) ×1 IMPLANT
SUT VIC AB 2-0 SH 27 (SUTURE) ×1
SUT VIC AB 2-0 SH 27XBRD (SUTURE) ×1 IMPLANT
SUT VICRYL 0 UR6 27IN ABS (SUTURE) ×1 IMPLANT
SUT VLOC 90 2/L VL 12 GS22 (SUTURE) ×2 IMPLANT
SUTURE MNCRL 4-0 27XMF (SUTURE) ×1 IMPLANT
SYR 10ML LL (SYRINGE) ×1 IMPLANT
TAPE TRANSPORE STRL 2 31045 (GAUZE/BANDAGES/DRESSINGS) ×1 IMPLANT
TUBING CONNECTING 10 (TUBING) IMPLANT

## 2022-10-27 NOTE — Anesthesia Postprocedure Evaluation (Signed)
Anesthesia Post Note  Patient: Holly Simon  Procedure(s) Performed: XI ROBOTIC ASSISTED VENTRAL HERNIA HERNIA REPAIR VENTRAL ADULT  Patient location during evaluation: PACU Anesthesia Type: General Level of consciousness: awake and alert Pain management: pain level controlled Vital Signs Assessment: post-procedure vital signs reviewed and stable Respiratory status: spontaneous breathing, nonlabored ventilation and respiratory function stable Cardiovascular status: blood pressure returned to baseline and stable Postop Assessment: no apparent nausea or vomiting Anesthetic complications: no   No notable events documented.   Last Vitals:  Vitals:   10/27/22 2157 10/27/22 2212  BP:  129/62  Pulse: 77 73  Resp: 14 16  Temp:  36.7 C  SpO2: (!) 89% 98%    Last Pain:  Vitals:   10/27/22 2212  TempSrc: Oral  PainSc:                  Foye Deer

## 2022-10-27 NOTE — Plan of Care (Signed)
  Problem: Fluid Volume: Goal: Ability to maintain a balanced intake and output will improve Outcome: Progressing   Problem: Coping: Goal: Ability to adjust to condition or change in health will improve Outcome: Progressing   Problem: Education: Goal: Ability to describe self-care measures that may prevent or decrease complications (Diabetes Survival Skills Education) will improve Outcome: Progressing   Problem: Elimination: Goal: Will not experience complications related to bowel motility Outcome: Progressing   Problem: Coping: Goal: Level of anxiety will decrease Outcome: Progressing

## 2022-10-27 NOTE — Anesthesia Preprocedure Evaluation (Signed)
Anesthesia Evaluation  Patient identified by MRN, date of birth, ID band Patient awake    Reviewed: Allergy & Precautions, H&P , NPO status , Patient's Chart, lab work & pertinent test results, reviewed documented beta blocker date and time   History of Anesthesia Complications Negative for: history of anesthetic complications  Airway Mallampati: I  TM Distance: >3 FB Neck ROM: full    Dental  (+) Dental Advidsory Given, Caps, Implants, Teeth Intact   Pulmonary neg pulmonary ROS, Continuous Positive Airway Pressure Ventilation    Pulmonary exam normal breath sounds clear to auscultation       Cardiovascular Exercise Tolerance: Good hypertension, (-) angina (-) Past MI and (-) Cardiac Stents Normal cardiovascular exam(-) dysrhythmias (-) Valvular Problems/Murmurs Rhythm:regular Rate:Normal     Neuro/Psych negative neurological ROS  negative psych ROS   GI/Hepatic Neg liver ROS,GERD  ,,  Endo/Other  diabetes  Morbid obesity  Renal/GU negative Renal ROS  negative genitourinary   Musculoskeletal   Abdominal   Peds  Hematology negative hematology ROS (+)   Anesthesia Other Findings Past Medical History: No date: Bowel obstruction (HCC) No date: Diabetes mellitus without complication (HCC) No date: Hypertension   Reproductive/Obstetrics negative OB ROS                             Anesthesia Physical Anesthesia Plan  ASA: 3 and emergent  Anesthesia Plan: General   Post-op Pain Management:    Induction: Intravenous, Rapid sequence and Cricoid pressure planned  PONV Risk Score and Plan: 3 and Ondansetron, Dexamethasone, Midazolam and Treatment may vary due to age or medical condition  Airway Management Planned: Oral ETT  Additional Equipment:   Intra-op Plan:   Post-operative Plan: Extubation in OR  Informed Consent: I have reviewed the patients History and Physical, chart, labs  and discussed the procedure including the risks, benefits and alternatives for the proposed anesthesia with the patient or authorized representative who has indicated his/her understanding and acceptance.     Dental Advisory Given  Plan Discussed with: Anesthesiologist, CRNA and Surgeon  Anesthesia Plan Comments:         Anesthesia Quick Evaluation

## 2022-10-27 NOTE — Interval H&P Note (Signed)
History and Physical Interval Note:  10/27/2022 11:42 AM  Holly Simon  has presented today for surgery, with the diagnosis of Ventral hernia with obstruction.  The various methods of treatment have been discussed with the patient and family. After consideration of risks, benefits and other options for treatment, the patient has consented to  Procedure(s): XI ROBOTIC ASSISTED VENTRAL HERNIA (N/A) HERNIA REPAIR VENTRAL ADULT (N/A) as a surgical intervention.  The patient's history has been reviewed, patient examined, no change in status, stable for surgery.  I have reviewed the patient's chart and labs.  Questions were answered to the patient's satisfaction.     Carolan Shiver

## 2022-10-27 NOTE — Transfer of Care (Signed)
Immediate Anesthesia Transfer of Care Note  Patient: Holly Simon  Procedure(s) Performed: XI ROBOTIC ASSISTED VENTRAL HERNIA HERNIA REPAIR VENTRAL ADULT  Patient Location: PACU  Anesthesia Type:General  Level of Consciousness: drowsy and patient cooperative  Airway & Oxygen Therapy: Patient Spontanous Breathing and Patient connected to face mask oxygen  Post-op Assessment: Report given to RN and Post -op Vital signs reviewed and stable  Post vital signs: Reviewed and stable  Last Vitals:  Vitals Value Taken Time  BP 122/59 10/27/22 1957  Temp 36.4 C 10/27/22 1954  Pulse 74 10/27/22 2001  Resp 9 10/27/22 2001  SpO2 98 % 10/27/22 2001  Vitals shown include unfiled device data.  Last Pain:  Vitals:   10/27/22 0936  TempSrc:   PainSc: 7       Patients Stated Pain Goal: 1 (10/27/22 0529)  Complications: No notable events documented.

## 2022-10-27 NOTE — Op Note (Addendum)
Preoperative diagnosis: Recurrent Ventral hernia.  Postoperative diagnosis: Recurrent Ventral hernia.  Procedure: Attempted robotic assisted laparoscopic ventral hernia repair converted to open recurrent ventral hernia repair with mesh Negative pressure dressing                   Anesthesia: GETA  Surgeon: Dr. Hazle Quant  Wound Classification: Clean  Indications:Patient is a 61 y.o. female developed a recurrent ventral hernia in the site of a previous incision. This was incarcerated with obstruction and repair was indicated.   Findings: 1. A 11 cm x 4 cm very complex recurrent incarcerated hernia 2. A 20 cm x 15 cm Phasix mesh used for repair 3. No hollow viscus organ injury identified during procedure 4. Adequate hemostasis 5. Wound dressed with Wound VAC measures 8 cm x 2 cm (16 sq cm)  Description of procedure: The patient was brought to the operating room and general anesthesia was induced. A time-out was completed verifying correct patient, procedure, site, positioning, and implant(s) and/or special equipment prior to beginning this procedure. Antibiotics were administered prior to making the incision. The anterior abdominal wall was prepped and draped in the standard sterile fashion.  Hassan technique was used to access the abdominal cavity at the right upper quadrant.  12 mm port inserted.  Abdomen cavity insufflated to 15 mmHg.  2 mm trocar were placed on the right lateral wall.  Lysis of adhesions were attempted robotically but I was unable to get any progress and decided to proceed with open hernia repair. A vertical midline incision incorporating the old incision was made.  The incision was deepened to the fascia. The hernia sac was then identified.  A very complex ventral hernia with multiple hernia within the main hernia was identified.  Abundant amount of adhesion within the hernias were identified as well.  This was a very difficult and time-consuming lysis of adhesion to be  able to relieve the small bowel from the hernia causing the obstruction.  Once all the adhesions and all the hernia sacs were freed, the bowel was able to be mobilized and the obstruction was resolved.  The peritoneum of the sac was entered and the contents were reduced. Intervening fascial bridges were cut to create a single defect.  Adhesions to the underside of the abdominal wall were lysed and the fascia was assessed.  A piece of 20 x 15 cm Phasix mesh was placed in the intra-abdominal plane and sutured circumferentially to the anterior fascia with multiple PDS 1 interrumpted sutures. The anterior fascia was then closed with a running stratafix 0 suture on midline. Subcutaneous tissues were closed with 3-0 V-Loc and the skin was closed with skin staples. A negative pressure dressing (DME) wound VAC was applied to the wound. The patient tolerated the procedure well and was brought to the postanesthesia care unit in stable condition.   Specimen: Hernia sac  Complications: None  Estimated Blood Loss: 200 mL

## 2022-10-27 NOTE — Anesthesia Procedure Notes (Signed)
Procedure Name: Intubation Date/Time: 10/27/2022 2:23 PM  Performed by: Mohammed Kindle, CRNAPre-anesthesia Checklist: Patient identified, Emergency Drugs available, Suction available and Patient being monitored Patient Re-evaluated:Patient Re-evaluated prior to induction Oxygen Delivery Method: Circle system utilized Preoxygenation: Pre-oxygenation with 100% oxygen Induction Type: IV induction, Rapid sequence and Cricoid Pressure applied Laryngoscope Size: McGraph and 3 Grade View: Grade I Tube type: Oral Tube size: 7.0 mm Number of attempts: 1 Airway Equipment and Method: Stylet Placement Confirmation: ETT inserted through vocal cords under direct vision, positive ETCO2, breath sounds checked- equal and bilateral and CO2 detector Secured at: 21 cm Tube secured with: Tape Dental Injury: Teeth and Oropharynx as per pre-operative assessment

## 2022-10-27 NOTE — Progress Notes (Signed)
Progress Note    Holly Simon  ONG:[id number] DOB: Feb 01, 1960  DOA: 10/25/2022 PCP: Enid Baas, MD      Brief Narrative:    Medical records reviewed and are as summarized below:  Holly Simon is a 62 y.o. female  with medical history significant of large ventral wall hernia, recurrent SBO, HTN, IIDM, morbid obesity, who presented to the hospital with abdominal pain and constipation. She had seen a surgeon in the past for evaluation for ventral hernia repair.  However, she was told that she has to lose some weight before surgery will be considered.        Assessment/Plan:   Principal Problem:   SBO (small bowel obstruction) (HCC) Active Problems:   Small bowel obstruction (HCC)   Obesity, Class III, BMI 40-49.9 (morbid obesity) (HCC)   Body mass index is 55.66 kg/m.  (Morbid obesity): This complicates overall care and prognosis.   Small bowel obstruction, large incarcerated ventral hernia: Plan for robotic assisted ventral hernia repair today.  Continue analgesics as needed for pain.  Continue IV fluids for hydration.  Follow-up with general surgeon.   Leukocytosis: Improved.  Likely reactive.   Type II DM: Metformin, glimepiride and pioglitazone been held.  Continue NovoLog sliding scale as needed.   Hypertension: Lisinopril on hold.  IV hydralazine as needed for severe hypertension.   Abnormal EKG, sinus bradycardia with nonspecific ST-T changes: Troponins negative.  No chest pain.  Diet Order             Diet NPO time specified Except for: Sips with Meds  Diet effective now                            Consultants: General surgeon  Procedures: None    Medications:    [MAR Hold] enoxaparin (LOVENOX) injection  0.5 mg/kg Subcutaneous Q24H   [MAR Hold] insulin aspart  0-20 Units Subcutaneous TID WC   [MAR Hold] insulin aspart  0-5 Units Subcutaneous QHS   [MAR Hold] latanoprost  1 drop Both Eyes QHS   [MAR Hold] pantoprazole  (PROTONIX) IV  40 mg Intravenous Q24H   Continuous Infusions:     Anti-infectives (From admission, onward)    None              Family Communication/Anticipated D/C date and plan/Code Status   DVT prophylaxis:      Code Status: Full Code  Family Communication: None Disposition Plan: Plan to discharge home   Status is: Inpatient Remains inpatient appropriate because: Small bowel obstruction       Subjective:   Interval events noted.  She complains of nausea and abdominal pain.  No vomiting.  Objective:    Vitals:   10/26/22 2022 10/27/22 0214 10/27/22 0411 10/27/22 0855  BP: 134/64 134/62 (!) 144/65 (!) 136/57  Pulse: 81 79 81 75  Resp: 19 17 20 18   Temp: 98.6 F (37 C) 98.6 F (37 C) 98.9 F (37.2 C) 98.8 F (37.1 C)  TempSrc: Oral Oral Oral Oral  SpO2: 97% 96% 94% 97%  Weight:      Height:       No data found.   Intake/Output Summary (Last 24 hours) at 10/27/2022 1138 Last data filed at 10/27/2022 1100 Gross per 24 hour  Intake 30 ml  Output 1750 ml  Net -1720 ml   Filed Weights   10/24/22 2154  Weight: 129.3 kg    Exam:  GEN: NAD SKIN: Warm and dry EYES: No pallor or icterus ENT: MMM, NG tube in place CV: RRR PULM: CTA B ABD: soft, obese, upper abdominal tenderness, +BS CNS: AAO x 3, non focal EXT: No edema or tenderness         Data Reviewed:   I have personally reviewed following labs and imaging studies:  Labs: Labs show the following:   Basic Metabolic Panel: Recent Labs  Lab 10/24/22 2156 10/25/22 0105 10/26/22 0346 10/27/22 0551  NA 134*  --  136 142  K 4.3  --  4.1 4.1  CL 100  --  103 105  CO2 20*  --  23 24  GLUCOSE 221*  --  232* 186*  BUN QUANTITY NOT SUFFICIENT, UNABLE TO PERFORM TEST 22 22 27*  CREATININE 0.83  --  0.70 0.66  CALCIUM 9.3  --  8.8* 8.7*  MG  --   --   --  1.8  PHOS  --   --   --  2.6   GFR Estimated Creatinine Clearance: 88.6 mL/min (by C-G formula based on SCr of  0.66 mg/dL). Liver Function Tests: Recent Labs  Lab 10/24/22 2156  AST 19  ALT 17  ALKPHOS 62  BILITOT 0.8  PROT 7.1  ALBUMIN 3.8   Recent Labs  Lab 10/24/22 2156  LIPASE 29   No results for input(s): "AMMONIA" in the last 168 hours. Coagulation profile No results for input(s): "INR", "PROTIME" in the last 168 hours.  CBC: Recent Labs  Lab 10/24/22 2156 10/26/22 0346  WBC 14.3* 7.9  HGB 12.9 12.3  HCT 41.7 37.7  MCV 102.0* 95.9  PLT 196 221   Cardiac Enzymes: No results for input(s): "CKTOTAL", "CKMB", "CKMBINDEX", "TROPONINI" in the last 168 hours. BNP (last 3 results) No results for input(s): "PROBNP" in the last 8760 hours. CBG: Recent Labs  Lab 10/26/22 0917 10/26/22 1147 10/26/22 1631 10/26/22 2124 10/27/22 0821  GLUCAP 230* 178* 135* 148* 174*   D-Dimer: No results for input(s): "DDIMER" in the last 72 hours. Hgb A1c: Recent Labs    10/25/22 0856  HGBA1C 6.6*   Lipid Profile: No results for input(s): "CHOL", "HDL", "LDLCALC", "TRIG", "CHOLHDL", "LDLDIRECT" in the last 72 hours. Thyroid function studies: No results for input(s): "TSH", "T4TOTAL", "T3FREE", "THYROIDAB" in the last 72 hours.  Invalid input(s): "FREET3" Anemia work up: No results for input(s): "VITAMINB12", "FOLATE", "FERRITIN", "TIBC", "IRON", "RETICCTPCT" in the last 72 hours. Sepsis Labs: Recent Labs  Lab 10/24/22 2156 10/25/22 0521 10/26/22 0346  WBC 14.3*  --  7.9  LATICACIDVEN  --  1.2  --     Microbiology No results found for this or any previous visit (from the past 240 hour(s)).  Procedures and diagnostic studies:  DG Abd Portable 1V-Small Bowel Obstruction Protocol-initial, 8 hr delay  Result Date: 10/26/2022 CLINICAL DATA:  Small-bowel obstruction, EXAM: PORTABLE ABDOMEN - 1 VIEW COMPARISON:  None Available. FINDINGS: Normal radiographs are obtained 8 hours following nasogastric administration of oral contrast. Contrast is seen filling multiple dilated loops  of small bowel in keeping with a mid to distal small bowel obstruction. There is no definite extension of contrast into ascending colon. No free intraperitoneal gas. No organomegaly. Osseous structures are age-appropriate. IMPRESSION: 1. Persistent mid to distal small bowel obstruction. Electronically Signed   By: Helyn Numbers M.D.   On: 10/26/2022 01:44               LOS: 2 days   Holly Simon  Holly Simon  [facility] [employer] loss adjuster on [email or web address]. If 7PM-7AM, please contact night-coverage at [email or web address]     10/27/2022, 11:38 AM

## 2022-10-28 ENCOUNTER — Encounter: Payer: Self-pay | Admitting: General Surgery

## 2022-10-28 ENCOUNTER — Inpatient Hospital Stay: Payer: BC Managed Care – PPO

## 2022-10-28 DIAGNOSIS — K56609 Unspecified intestinal obstruction, unspecified as to partial versus complete obstruction: Secondary | ICD-10-CM | POA: Diagnosis not present

## 2022-10-28 LAB — GLUCOSE, CAPILLARY
Glucose-Capillary: 186 mg/dL — ABNORMAL HIGH (ref 70–99)
Glucose-Capillary: 168 mg/dL — ABNORMAL HIGH (ref 70–99)
Glucose-Capillary: 164 mg/dL — ABNORMAL HIGH (ref 70–99)
Glucose-Capillary: 130 mg/dL — ABNORMAL HIGH (ref 70–99)

## 2022-10-28 LAB — CBC
HCT: 31.9 % — ABNORMAL LOW (ref 36.0–46.0)
Hemoglobin: 10.4 g/dL — ABNORMAL LOW (ref 12.0–15.0)
MCH: 31 pg (ref 26.0–34.0)
MCHC: 32.6 g/dL (ref 30.0–36.0)
MCV: 94.9 fL (ref 80.0–100.0)
Platelets: 201 10*3/uL (ref 150–400)
RBC: 3.36 MIL/uL — ABNORMAL LOW (ref 3.87–5.11)
RDW: 13.2 % (ref 11.5–15.5)
WBC: 7.7 10*3/uL (ref 4.0–10.5)
nRBC: 0 % (ref 0.0–0.2)

## 2022-10-28 LAB — BASIC METABOLIC PANEL
Anion gap: 10 (ref 5–15)
BUN: 33 mg/dL — ABNORMAL HIGH (ref 8–23)
CO2: 22 mmol/L (ref 22–32)
Calcium: 7.8 mg/dL — ABNORMAL LOW (ref 8.9–10.3)
Chloride: 108 mmol/L (ref 98–111)
Creatinine, Ser: 0.86 mg/dL (ref 0.44–1.00)
GFR, Estimated: >60 mL/min (ref >60)
Glucose, Bld: 226 mg/dL — ABNORMAL HIGH (ref 70–99)
Potassium: 4.2 mmol/L (ref 3.5–5.1)
Sodium: 140 mmol/L (ref 135–145)

## 2022-10-28 LAB — PHOSPHORUS: Phosphorus: 3.1 mg/dL (ref 2.5–4.6)

## 2022-10-28 LAB — MAGNESIUM: Magnesium: 1.7 mg/dL (ref 1.7–2.4)

## 2022-10-28 MED ORDER — SODIUM CHLORIDE 0.9 % IV SOLN: INTRAVENOUS | Status: DC | PRN

## 2022-10-28 MED ORDER — ACETAMINOPHEN 10 MG/ML IV SOLN
1000.0000 mg | Freq: Once | INTRAVENOUS | Status: AC
  Administered 2022-10-28: 1000 mg via INTRAVENOUS
  Filled 2022-10-28: qty 100

## 2022-10-28 MED ORDER — LACTATED RINGERS IV SOLN: INTRAVENOUS | Status: DC

## 2022-10-28 MED ORDER — ORAL CARE MOUTH RINSE: 15.0000 mL | OROMUCOSAL | Status: DC | PRN

## 2022-10-28 NOTE — Progress Notes (Signed)
Patient ID: Holly Simon, female   DOB: March 24, 1960, 62 y.o.   MRN: [id number]     SURGICAL PROGRESS NOTE   Hospital Day(s): 3.   Interval History: Patient seen and examined, no acute events or new complaints overnight. Patient reports feeling sore. No severe pain. Not passing gas.   Patient status post complex ventral hernia repair with mesh. No sign of complications.   Vital signs in last 24 hours: [min-max] current  Temp:  [97.5 F (36.4 C)-98.8 F (37.1 C)] 98 F (36.7 C) (09/27 0345) Pulse Rate:  [71-83] 78 (09/27 0345) Resp:  [7-23] 17 (09/27 0345) BP: (84-143)/(40-72) 132/56 (09/27 0345) SpO2:  [89 %-100 %] 98 % (09/27 0345) Weight:  [129.3 kg] 129.3 kg (09/26 1141)     Height: 5' (152.4 cm) Weight: 129.3 kg BMI (Calculated): 55.66   Physical Exam:  Constitutional: alert, cooperative and no distress  Respiratory: breathing non-labored at rest  Cardiovascular: regular rate and sinus rhythm  Gastrointestinal: soft, non-tender, and non-distended  Labs:     Latest Ref Rng & Units 10/28/2022    6:58 AM 10/26/2022    3:46 AM 10/24/2022    9:56 PM  CBC  WBC 4.0 - 10.5 K/uL 7.7  7.9  14.3   Hemoglobin 12.0 - 15.0 g/dL 59.5  63.8  75.6   Hematocrit 36.0 - 46.0 % 31.9  37.7  41.7   Platelets 150 - 400 K/uL 201  221  196       Latest Ref Rng & Units 10/28/2022    6:58 AM 10/27/2022    5:51 AM 10/26/2022    3:46 AM  CMP  Glucose 70 - 99 mg/dL 433  295  188   BUN 8 - 23 mg/dL 33  27  22   Creatinine 0.44 - 1.00 mg/dL 4.16  6.06  3.01   Sodium 135 - 145 mmol/L 140  142  136   Potassium 3.5 - 5.1 mmol/L 4.2  4.1  4.1   Chloride 98 - 111 mmol/L 108  105  103   CO2 22 - 32 mmol/L 22  24  23    Calcium 8.9 - 10.3 mg/dL 7.8  8.7  8.8     Imaging studies: No new pertinent imaging studies   Assessment/Plan:  62 y.o. female with recurrent ventral hernia with obstruction 1 Day Post-Op s/p recurrent ventral hernia repair, complicated by pertinent comorbidities including morbid  obesity.  -Stable vital signs.  -Expected post operative pain controlled with current pain medications.  -Expected post operative ileus. Will wait for bowel function return.  -Continue NGT, IVF, pain management.  -I consulted PT for evaluation and mobilization helping with weakness and pain.   Gae Gallop, MD

## 2022-10-28 NOTE — Evaluation (Addendum)
Occupational Therapy Evaluation Patient Details Name: Holly Simon MRN: [id number] DOB: Sep 10, 1960 Today's Date: 10/28/2022   History of Present Illness Pt is a 62 yo female s/p repeat ventral hernia repair, noted for small bowel obstruction as well. PMH of HTN, DM, previous SBO.   Clinical Impression   Pt was seen for OT evaluation this date. Prior to hospital admission, pt was living with her husband, totally IND and working a full time job.   Pt presents to acute OT demonstrating impaired ADL performance and functional mobility 2/2 weakness and pain following ventral hernia repair for SBO (See OT problem list for additional functional deficits). Pt currently requires SUP for rolling in bed and sidelying to sit, CGA for BLE management returning to supine in bed d/t pain in her abdomen. No pain at rest, a little bit with movement. She performed STS and SPT from EOB<>BSC with SUP/SBA for safety and management of NG tube and IV. Pt able to maintain seated balance at EOB x10 mins during session with normal balance. OT provided education on use of AE/AD/DME for reducing pain and increased bending for ADL performance with pt verbalized understanding. Pt would benefit from skilled OT services to address noted impairments and functional limitations (see below for any additional details) in order to maximize safety and independence while minimizing falls risk and caregiver burden.     If plan is discharge home, recommend the following: A little help with walking and/or transfers;A little help with bathing/dressing/bathroom;Help with stairs or ramp for entrance;Assist for transportation;Assistance with cooking/housework    Functional Status Assessment  Patient has had a recent decline in their functional status and demonstrates the ability to make significant improvements in function in a reasonable and predictable amount of time.  Equipment Recommendations  Tub/shower seat (short bed rail)     Recommendations for Other Services       Precautions / Restrictions Precautions Precautions: Fall Precaution Comments: wound vac, NG tube Restrictions Weight Bearing Restrictions: No      Mobility Bed Mobility Overal bed mobility: Needs Assistance Bed Mobility: Sidelying to Sit, Rolling, Sit to Supine Rolling: Supervision, Used rails Sidelying to sit: Supervision, Used rails   Sit to supine: Contact guard assist   General bed mobility comments: log roll technique    Transfers Overall transfer level: Needs assistance Equipment used: Rolling walker (2 wheels) Transfers: Sit to/from Stand, Bed to chair/wheelchair/BSC Sit to Stand: Supervision Stand pivot transfers: Supervision         General transfer comment: SUP/SBA for bed<>BSC transfer      Balance Overall balance assessment: Needs assistance Sitting-balance support: Feet supported Sitting balance-Leahy Scale: Good     Standing balance support: Bilateral upper extremity supported Standing balance-Leahy Scale: Good                             ADL either performed or assessed with clinical judgement   ADL Overall ADL's : Needs assistance/impaired                         Toilet Transfer: Contact guard assist;BSC/3in1   Toileting- Clothing Manipulation and Hygiene: Supervision/safety       Functional mobility during ADLs: Contact guard assist       Vision         Perception         Praxis         Pertinent Vitals/Pain Pain Assessment Pain  Assessment: Faces Faces Pain Scale: Hurts a little bit Pain Location: abdomen Pain Descriptors / Indicators: Aching, Sore Pain Intervention(s): Monitored during session, Repositioned     Extremity/Trunk Assessment Upper Extremity Assessment Upper Extremity Assessment: Overall WFL for tasks assessed   Lower Extremity Assessment Lower Extremity Assessment: Overall WFL for tasks assessed       Communication     Cognition  Arousal: Alert Behavior During Therapy: WFL for tasks assessed/performed Overall Cognitive Status: Within Functional Limits for tasks assessed                                       General Comments       Exercises Other Exercises Other Exercises: OT provided education on use of DME/AE/AD for ADL performance to reduce pain/increased bending, e.g. use of reacher, long handled sponge and adding short bed rail to her bed at home and removing box spring d/t increased height of her bed   Shoulder Instructions      Home Living Family/patient expects to be discharged to:: Private residence Living Arrangements: Spouse/significant other;Parent Available Help at Discharge: Family Type of Home: House Home Access: Stairs to enter Secretary/administrator of Steps: 3 Entrance Stairs-Rails: Left Home Layout: Two level;Able to live on main level with bedroom/bathroom     Bathroom Shower/Tub: Tub/shower unit         Home Equipment: None          Prior Functioning/Environment Prior Level of Function : Independent/Modified Independent;Working/employed                        OT Problem List: Decreased strength;Impaired balance (sitting and/or standing);Decreased knowledge of use of DME or AE      OT Treatment/Interventions: Self-care/ADL training;Therapeutic exercise;Patient/family education;Energy conservation;Therapeutic activities;DME and/or AE instruction    OT Goals(Current goals can be found in the care plan section) Acute Rehab OT Goals Patient Stated Goal: return home Time For Goal Achievement: 11/11/22 Potential to Achieve Goals: Good ADL Goals Pt Will Perform Lower Body Bathing: with modified independence;sit to/from stand;with adaptive equipment Pt Will Perform Lower Body Dressing: with modified independence;with adaptive equipment;sit to/from stand Pt Will Transfer to Toilet: with modified independence;grab bars;regular height toilet  OT Frequency:  Min 1X/week    Co-evaluation              AM-PAC OT "6 Clicks" Daily Activity     Outcome Measure Help from another person eating meals?: None Help from another person taking care of personal grooming?: None Help from another person toileting, which includes using toliet, bedpan, or urinal?: None Help from another person bathing (including washing, rinsing, drying)?: A Little Help from another person to put on and taking off regular upper body clothing?: A Little Help from another person to put on and taking off regular lower body clothing?: A Little 6 Click Score: 21   End of Session Equipment Utilized During Treatment: Rolling walker (2 wheels) Nurse Communication: Mobility status  Activity Tolerance: Patient tolerated treatment well Patient left: in bed;with bed alarm set;with call bell/phone within reach  OT Visit Diagnosis: Other abnormalities of gait and mobility (R26.89);Unsteadiness on feet (R26.81);Muscle weakness (generalized) (M62.81)                Time: 2725-3664 OT Time Calculation (min): 44 min Charges:  OT Evaluation $OT Eval Moderate Complexity: 1 Mod OT Treatments $Self Care/Home Management :  23-37 mins  [facility], OTR/L 10/28/22, 4:13 PM   Constance Goltz 10/28/2022, 4:13 PM

## 2022-10-28 NOTE — Progress Notes (Signed)
       CROSS COVER NOTE  NAME: Holly Simon MRN: [id number] DOB : 1960-05-09    Concern as stated by nurse / staff   Chat message received from RN "....pt states she woke up having "crazy dreams" around 6pm, went back to sleep and now has double vision (the clock only everything else is a little blurry", she admits to having a "lazy tired left eye at times" and this happens. Denies shortness of breath or chest pain, Blood glucose 130, mild fever at 100.2 otherwise vitals stable, . No pain meds wince earlier this afternoon Just wanted you to know. "    Pertinent findings on chart review: POD 1 Robotic assisted ventral hernia repair  Assessment and  Interventions   Assessment:    10/28/2022    8:04 PM 10/28/2022    8:44 AM 10/28/2022    3:45 AM  Vitals with BMI  Systolic 135 131 841  Diastolic 52 60 56  Pulse 80 78 78   Febrile  100.2 Endorses new  productive cough: double vision which she has had in the past at times when she is really tired States she can feel her belly gurgling now but no flatus passage. No increase abdominal pain; denies nausea  Alert and oriented No focal deficits on exam Respirations non labored sats 97% on room air. Heart rate milly elevated at time of my exam at 93 (febrile at time) Cheeks red. NG tube patent to wall suction   Chest ray - clear no effusion or consolication : mild cardiomegaly Plan: Rest tonight 1 dose of IV acetaminophen for fever Start IS Continue mobilization in am       Donnie Mesa NP [facility] Cross Cover 7pm-7am - check amion for availability Pager [phone]

## 2022-10-28 NOTE — Progress Notes (Signed)
Progress Note    Holly Simon  ZOX:[id number] DOB: 03-Dec-1960  DOA: 10/25/2022 PCP: Holly Baas, MD      Brief Narrative:    Medical records reviewed and are as summarized below:  Holly Simon is a 62 y.o. female  with medical history significant of large ventral wall hernia, recurrent SBO, HTN, IIDM, morbid obesity, who presented to the hospital with abdominal pain and constipation. She had seen a surgeon in the past for evaluation for ventral hernia repair.  However, she was told that she has to lose some weight before surgery will be considered.        Assessment/Plan:   Principal Problem:   SBO (small bowel obstruction) (HCC) Active Problems:   Small bowel obstruction (HCC)   Obesity, Class III, BMI 40-49.9 (morbid obesity) (HCC)   Body mass index is 55.66 kg/m.  (Morbid obesity): This complicates overall care and prognosis.   Small bowel obstruction, large incarcerated ventral hernia: S/p open ventral hernia repair with mesh.  Continue analgesics as needed for pain.  Restart IV fluids for hydration since she is NPO.  NG tube is still in place.  Follow-up with general surgeon.   Leukocytosis: Improved.  Likely reactive.   Type II DM: Metformin, glimepiride and pioglitazone been held.  Continue NovoLog sliding scale as needed.   Hypertension: Lisinopril on hold.  IV hydralazine as needed for severe hypertension.   Abnormal EKG, sinus bradycardia with nonspecific ST-T changes: Troponins negative.  No chest pain.  Diet Order             Diet NPO time specified Except for: Sips with Meds  Diet effective now                            Consultants: General surgeon  Procedures: None    Medications:    enoxaparin (LOVENOX) injection  0.5 mg/kg Subcutaneous Q24H   gabapentin  300 mg Oral TID   insulin aspart  0-20 Units Subcutaneous TID WC   insulin aspart  0-5 Units Subcutaneous QHS   ketorolac  30 mg Intravenous Q8H    latanoprost  1 drop Both Eyes QHS   pantoprazole (PROTONIX) IV  40 mg Intravenous Q24H   Continuous Infusions:  lactated ringers 75 mL/hr at 10/28/22 1050      Anti-infectives (From admission, onward)    None              Family Communication/Anticipated D/C date and plan/Code Status   DVT prophylaxis:      Code Status: Full Code  Family Communication: None Disposition Plan: Plan to discharge home   Status is: Inpatient Remains inpatient appropriate because: Small bowel obstruction       Subjective:   Interval events noted.  She complains of abdominal pain.  No flatus or bowel movement.  Holly Simon, PT, was at the bedside  Objective:    Vitals:   10/27/22 2245 10/27/22 2347 10/28/22 0345 10/28/22 0844  BP: (!) 143/68 (!) 125/55 (!) 132/56 131/60  Pulse: 78 81 78 78  Resp: 17 17 17 18   Temp: 98.2 F (36.8 C) 98.2 F (36.8 C) 98 F (36.7 C) 99 F (37.2 C)  TempSrc: Oral Oral Oral Oral  SpO2: 99% 99% 98% 98%  Weight:      Height:       No data found.   Intake/Output Summary (Last 24 hours) at 10/28/2022 1455 Last data filed at  10/28/2022 1235 Gross per 24 hour  Intake 2760 ml  Output 1025 ml  Net 1735 ml   Filed Weights   10/24/22 2154 10/27/22 1141  Weight: 129.3 kg 129.3 kg    Exam:  GEN: NAD SKIN: Warm and dry EYES: No pallor or icterus ENT: MMM.  NG tube in place CV: RRR PULM: CTA B ABD: soft, ND, appropriate surgical tenderness, +BS, + wound VAC to surgical incisional wound CNS: AAO x 3, non focal EXT: No edema or tenderness       Data Reviewed:   I have personally reviewed following labs and imaging studies:  Labs: Labs show the following:   Basic Metabolic Panel: Recent Labs  Lab 10/24/22 2156 10/25/22 0105 10/26/22 0346 10/27/22 0551 10/28/22 0658  NA 134*  --  136 142 140  K 4.3  --  4.1 4.1 4.2  CL 100  --  103 105 108  CO2 20*  --  23 24 22   GLUCOSE 221*  --  232* 186* 226*  BUN QUANTITY NOT  SUFFICIENT, UNABLE TO PERFORM TEST 22 22 27* 33*  CREATININE 0.83  --  0.70 0.66 0.86  CALCIUM 9.3  --  8.8* 8.7* 7.8*  MG  --   --   --  1.8 1.7  PHOS  --   --   --  2.6 3.1   GFR Estimated Creatinine Clearance: 82.4 mL/min (by C-G formula based on SCr of 0.86 mg/dL). Liver Function Tests: Recent Labs  Lab 10/24/22 2156  AST 19  ALT 17  ALKPHOS 62  BILITOT 0.8  PROT 7.1  ALBUMIN 3.8   Recent Labs  Lab 10/24/22 2156  LIPASE 29   No results for input(s): "AMMONIA" in the last 168 hours. Coagulation profile No results for input(s): "INR", "PROTIME" in the last 168 hours.  CBC: Recent Labs  Lab 10/24/22 2156 10/26/22 0346 10/28/22 0658  WBC 14.3* 7.9 7.7  HGB 12.9 12.3 10.4*  HCT 41.7 37.7 31.9*  MCV 102.0* 95.9 94.9  PLT 196 221 201   Cardiac Enzymes: No results for input(s): "CKTOTAL", "CKMB", "CKMBINDEX", "TROPONINI" in the last 168 hours. BNP (last 3 results) No results for input(s): "PROBNP" in the last 8760 hours. CBG: Recent Labs  Lab 10/27/22 1851 10/27/22 1954 10/27/22 2217 10/28/22 0852 10/28/22 1202  GLUCAP 143* 180* 195* 186* 168*   D-Dimer: No results for input(s): "DDIMER" in the last 72 hours. Hgb A1c: No results for input(s): "HGBA1C" in the last 72 hours.  Lipid Profile: No results for input(s): "CHOL", "HDL", "LDLCALC", "TRIG", "CHOLHDL", "LDLDIRECT" in the last 72 hours. Thyroid function studies: No results for input(s): "TSH", "T4TOTAL", "T3FREE", "THYROIDAB" in the last 72 hours.  Invalid input(s): "FREET3" Anemia work up: No results for input(s): "VITAMINB12", "FOLATE", "FERRITIN", "TIBC", "IRON", "RETICCTPCT" in the last 72 hours. Sepsis Labs: Recent Labs  Lab 10/24/22 2156 10/25/22 0521 10/26/22 0346 10/28/22 0658  WBC 14.3*  --  7.9 7.7  LATICACIDVEN  --  1.2  --   --     Microbiology No results found for this or any previous visit (from the past 240 hour(s)).  Procedures and diagnostic studies:  No results  found.             LOS: 3 days   Holly Simon  [facility]   Pager on [email or web address]. If 7PM-7AM, please contact night-coverage at [email or web address]     10/28/2022, 2:55 PM

## 2022-10-28 NOTE — Evaluation (Signed)
Physical Therapy Evaluation Patient Details Name: Holly Simon MRN: [id number] DOB: 29-Mar-1960 Today's Date: 10/28/2022  History of Present Illness  Pt is a 62 yo female s/p repeat ventral hernia repair, noted for small bowel obstruction as well. PMH of HTN, DM, previous SBO.   Clinical Impression  Pt A&Ox4, very pleasant and motivated. Reported at baseline she works at [employer], is independent. The patient was instructed in the log roll technique and performed with supervision. Sit <> Stand with RW and she was able to ambulate ~124ft, supervision. She did report some dizziness towards the end of ambulation but HR and spO2 WFLs.  Overall the patient demonstrated deficits (see "PT Problem List") that impede the patient's functional abilities, safety, and mobility and would benefit from skilled PT intervention.         If plan is discharge home, recommend the following: Assistance with cooking/housework;Assist for transportation;Help with stairs or ramp for entrance   Can travel by private vehicle        Equipment Recommendations Rolling walker (2 wheels)  Recommendations for Other Services       Functional Status Assessment Patient has had a recent decline in their functional status and demonstrates the ability to make significant improvements in function in a reasonable and predictable amount of time.     Precautions / Restrictions Precautions Precautions: Fall Precaution Comments: wound vac, NG tube Restrictions Weight Bearing Restrictions: No      Mobility  Bed Mobility Overal bed mobility: Needs Assistance Bed Mobility: Rolling, Sidelying to Sit Rolling: Supervision, Used rails Sidelying to sit: Supervision, Used rails       General bed mobility comments: log roll technique    Transfers Overall transfer level: Needs assistance Equipment used: Rolling walker (2 wheels) Transfers: Sit to/from Stand Sit to Stand: Supervision                 Ambulation/Gait Ambulation/Gait assistance: Contact guard assist Gait Distance (Feet): 110 Feet Assistive device: Rolling walker (2 wheels)         General Gait Details: towards end of ambulation reported feeling a little light headed, HR and spO2 WFLs on room air  Stairs            Wheelchair Mobility     Tilt Bed    Modified Rankin (Stroke Patients Only)       Balance Overall balance assessment: Needs assistance Sitting-balance support: Feet supported Sitting balance-Leahy Scale: Good     Standing balance support: Bilateral upper extremity supported Standing balance-Leahy Scale: Good                               Pertinent Vitals/Pain Pain Assessment Pain Assessment: 0-10 Pain Score: 4  Pain Descriptors / Indicators: Aching Pain Intervention(s): Limited activity within patient's tolerance, Monitored during session, Repositioned, RN gave pain meds during session    Home Living Family/patient expects to be discharged to:: Private residence Living Arrangements: Spouse/significant other;Parent Available Help at Discharge: Family Type of Home: House Home Access: Stairs to enter Entrance Stairs-Rails: Left Entrance Stairs-Number of Steps: 3   Home Layout: Two level;Able to live on main level with bedroom/bathroom Home Equipment: None      Prior Function Prior Level of Function : Independent/Modified Independent;Working/employed                     Extremity/Trunk Assessment   Upper Extremity Assessment Upper Extremity Assessment: Overall WFL for  tasks assessed    Lower Extremity Assessment Lower Extremity Assessment: Overall WFL for tasks assessed       Communication      Cognition Arousal: Alert Behavior During Therapy: WFL for tasks assessed/performed Overall Cognitive Status: Within Functional Limits for tasks assessed                                          General Comments      Exercises      Assessment/Plan    PT Assessment Patient needs continued PT services  PT Problem List Pain;Decreased strength;Decreased activity tolerance;Decreased balance;Decreased mobility;Decreased knowledge of use of DME       PT Treatment Interventions DME instruction;Neuromuscular re-education;Gait training;Stair training;Patient/family education;Functional mobility training;Therapeutic activities;Therapeutic exercise;Balance training    PT Goals (Current goals can be found in the Care Plan section)  Acute Rehab PT Goals Patient Stated Goal: to go home PT Goal Formulation: With patient Time For Goal Achievement: 11/11/22 Potential to Achieve Goals: Good    Frequency Min 1X/week     Co-evaluation               AM-PAC PT "6 Clicks" Mobility  Outcome Measure Help needed turning from your back to your side while in a flat bed without using bedrails?: A Little Help needed moving from lying on your back to sitting on the side of a flat bed without using bedrails?: A Little Help needed moving to and from a bed to a chair (including a wheelchair)?: None Help needed standing up from a chair using your arms (e.g., wheelchair or bedside chair)?: None Help needed to walk in hospital room?: None Help needed climbing 3-5 steps with a railing? : None 6 Click Score: 22    End of Session   Activity Tolerance: Patient tolerated treatment well Patient left: in chair;with call bell/phone within reach Nurse Communication: Mobility status PT Visit Diagnosis: Other abnormalities of gait and mobility (R26.89)    Time: 7829-5621 PT Time Calculation (min) (ACUTE ONLY): 32 min   Charges:   PT Evaluation $PT Eval Low Complexity: 1 Low PT Treatments $Therapeutic Activity: 23-37 mins PT General Charges $$ ACUTE PT VISIT: 1 Visit        Olga Coaster PT, DPT 12:27 PM,10/28/22

## 2022-10-29 DIAGNOSIS — K56609 Unspecified intestinal obstruction, unspecified as to partial versus complete obstruction: Secondary | ICD-10-CM | POA: Diagnosis not present

## 2022-10-29 LAB — BASIC METABOLIC PANEL
Anion gap: 10 (ref 5–15)
BUN: 36 mg/dL — ABNORMAL HIGH (ref 8–23)
CO2: 23 mmol/L (ref 22–32)
Calcium: 7.9 mg/dL — ABNORMAL LOW (ref 8.9–10.3)
Chloride: 108 mmol/L (ref 98–111)
Creatinine, Ser: 0.86 mg/dL (ref 0.44–1.00)
GFR, Estimated: >60 mL/min (ref >60)
Glucose, Bld: 128 mg/dL — ABNORMAL HIGH (ref 70–99)
Potassium: 3.2 mmol/L — ABNORMAL LOW (ref 3.5–5.1)
Sodium: 141 mmol/L (ref 135–145)

## 2022-10-29 LAB — GLUCOSE, CAPILLARY
Glucose-Capillary: 115 mg/dL — ABNORMAL HIGH (ref 70–99)
Glucose-Capillary: 174 mg/dL — ABNORMAL HIGH (ref 70–99)
Glucose-Capillary: 148 mg/dL — ABNORMAL HIGH (ref 70–99)
Glucose-Capillary: 141 mg/dL — ABNORMAL HIGH (ref 70–99)

## 2022-10-29 LAB — MAGNESIUM: Magnesium: 1.7 mg/dL (ref 1.7–2.4)

## 2022-10-29 MED ORDER — POTASSIUM CHLORIDE CRYS ER 20 MEQ PO TBCR
40.0000 meq | EXTENDED_RELEASE_TABLET | Freq: Once | ORAL | Status: AC
  Administered 2022-10-29: 40 meq via ORAL
  Filled 2022-10-29: qty 2

## 2022-10-29 MED ORDER — OXYCODONE HCL 5 MG PO TABS
5.0000 mg | ORAL_TABLET | Freq: Four times a day (QID) | ORAL | Status: DC | PRN
  Administered 2022-10-31 – 2022-11-01 (×2): 5 mg via ORAL
  Filled 2022-10-29 (×2): qty 1

## 2022-10-29 NOTE — Plan of Care (Signed)

## 2022-10-29 NOTE — Progress Notes (Signed)
Progress Note    Holly Simon  WUJ:[id number] DOB: 15-Oct-1960  DOA: 10/25/2022 PCP: Enid Baas, MD      Brief Narrative:    Medical records reviewed and are as summarized below:  Holly Simon is a 62 y.o. female  with medical history significant of large ventral wall hernia, recurrent SBO, HTN, IIDM, morbid obesity, who presented to the hospital with abdominal pain and constipation. She had seen a surgeon in the past for evaluation for ventral hernia repair.  However, she was told that she has to lose some weight before surgery will be considered.        Assessment/Plan:   Principal Problem:   SBO (small bowel obstruction) (HCC) Active Problems:   Small bowel obstruction (HCC)   Obesity, Class III, BMI 40-49.9 (morbid obesity) (HCC)   Body mass index is 55.66 kg/m.  (Morbid obesity): This complicates overall care and prognosis.   Small bowel obstruction, large incarcerated ventral hernia: S/p open ventral hernia repair with mesh on 10/27/2022.  Analgesics as needed for pain.  She is tolerating clear liquid diet.  Discontinue IV fluids.  Follow-up with general surgeon.   Leukocytosis: Improved.     Hypokalemia: Replete potassium and monitor levels   Type II DM: Metformin, glimepiride and pioglitazone been held.  Continue NovoLog sliding scale as needed.   Hypertension: Lisinopril on hold.  IV hydralazine as needed for severe hypertension.   Abnormal EKG, sinus bradycardia with nonspecific ST-T changes: Troponins negative.  No chest pain.  Diet Order             Diet clear liquid Fluid consistency: Thin  Diet effective now                            Consultants: General surgeon  Procedures: None    Medications:    enoxaparin (LOVENOX) injection  0.5 mg/kg Subcutaneous Q24H   gabapentin  300 mg Oral TID   insulin aspart  0-20 Units Subcutaneous TID WC   insulin aspart  0-5 Units Subcutaneous QHS   ketorolac  30 mg  Intravenous Q8H   latanoprost  1 drop Both Eyes QHS   pantoprazole (PROTONIX) IV  40 mg Intravenous Q24H   Continuous Infusions:  sodium chloride Stopped (10/28/22 2257)      Anti-infectives (From admission, onward)    None              Family Communication/Anticipated D/C date and plan/Code Status   DVT prophylaxis:      Code Status: Full Code  Family Communication: None Disposition Plan: Plan to discharge home   Status is: Inpatient Remains inpatient appropriate because: Small bowel obstruction       Subjective:   Interval events noted.  She feels better today.  She complains of abdominal soreness.  She has passed flatus but no bowel movement.  She is tolerating clear liquid diet.  Objective:    Vitals:   10/28/22 0844 10/28/22 2004 10/29/22 0415 10/29/22 0730  BP: 131/60 (!) 135/52 (!) 142/56 (!) 124/45  Pulse: 78 80 78 84  Resp: 18 19 16 16   Temp: 99 F (37.2 C) 100.2 F (37.9 C) 98.2 F (36.8 C) 98.2 F (36.8 C)  TempSrc: Oral Oral Oral Oral  SpO2: 98% 98% 100% 94%  Weight:      Height:       No data found.   Intake/Output Summary (Last 24 hours) at 10/29/2022  1149 Last data filed at 10/29/2022 1135 Gross per 24 hour  Intake 2149.64 ml  Output 1600 ml  Net 549.64 ml   Filed Weights   10/24/22 2154 10/27/22 1141  Weight: 129.3 kg 129.3 kg    Exam:  GEN: NAD SKIN: Warm and dry EYES: No pallor or icterus ENT: MMM CV: RRR PULM: CTA B ABD: soft, ND, appropriate surgical tenderness, +BS, + wound VAC chest surgical incisional wound is intact CNS: AAO x 3, non focal EXT: No edema or tenderness      Data Reviewed:   I have personally reviewed following labs and imaging studies:  Labs: Labs show the following:   Basic Metabolic Panel: Recent Labs  Lab 10/24/22 2156 10/25/22 0105 10/26/22 0346 10/27/22 0551 10/28/22 0658 10/29/22 0516  NA 134*  --  136 142 140 141  K 4.3  --  4.1 4.1 4.2 3.2*  CL 100  --  103 105  108 108  CO2 20*  --  23 24 22 23   GLUCOSE 221*  --  232* 186* 226* 128*  BUN QUANTITY NOT SUFFICIENT, UNABLE TO PERFORM TEST 22 22 27* 33* 36*  CREATININE 0.83  --  0.70 0.66 0.86 0.86  CALCIUM 9.3  --  8.8* 8.7* 7.8* 7.9*  MG  --   --   --  1.8 1.7 1.7  PHOS  --   --   --  2.6 3.1  --    GFR Estimated Creatinine Clearance: 82.4 mL/min (by C-G formula based on SCr of 0.86 mg/dL). Liver Function Tests: Recent Labs  Lab 10/24/22 2156  AST 19  ALT 17  ALKPHOS 62  BILITOT 0.8  PROT 7.1  ALBUMIN 3.8   Recent Labs  Lab 10/24/22 2156  LIPASE 29   No results for input(s): "AMMONIA" in the last 168 hours. Coagulation profile No results for input(s): "INR", "PROTIME" in the last 168 hours.  CBC: Recent Labs  Lab 10/24/22 2156 10/26/22 0346 10/28/22 0658  WBC 14.3* 7.9 7.7  HGB 12.9 12.3 10.4*  HCT 41.7 37.7 31.9*  MCV 102.0* 95.9 94.9  PLT 196 221 201   Cardiac Enzymes: No results for input(s): "CKTOTAL", "CKMB", "CKMBINDEX", "TROPONINI" in the last 168 hours. BNP (last 3 results) No results for input(s): "PROBNP" in the last 8760 hours. CBG: Recent Labs  Lab 10/28/22 1202 10/28/22 1559 10/28/22 2026 10/29/22 0733 10/29/22 1122  GLUCAP 168* 164* 130* 115* 174*   D-Dimer: No results for input(s): "DDIMER" in the last 72 hours. Hgb A1c: No results for input(s): "HGBA1C" in the last 72 hours.  Lipid Profile: No results for input(s): "CHOL", "HDL", "LDLCALC", "TRIG", "CHOLHDL", "LDLDIRECT" in the last 72 hours. Thyroid function studies: No results for input(s): "TSH", "T4TOTAL", "T3FREE", "THYROIDAB" in the last 72 hours.  Invalid input(s): "FREET3" Anemia work up: No results for input(s): "VITAMINB12", "FOLATE", "FERRITIN", "TIBC", "IRON", "RETICCTPCT" in the last 72 hours. Sepsis Labs: Recent Labs  Lab 10/24/22 2156 10/25/22 0521 10/26/22 0346 10/28/22 0658  WBC 14.3*  --  7.9 7.7  LATICACIDVEN  --  1.2  --   --     Microbiology No results found  for this or any previous visit (from the past 240 hour(s)).  Procedures and diagnostic studies:  DG Chest Port 1 View  Result Date: 10/28/2022 CLINICAL DATA:  Fever and cough. EXAM: PORTABLE CHEST 1 VIEW COMPARISON:  None Available. FINDINGS: Enteric tube extends below the diaphragm with poorly visualized tip, likely in the distal stomach or  beyond the inferior margin of the image. No focal consolidation, pleural effusion, or pneumothorax. Mild cardiomegaly. No acute osseous pathology. IMPRESSION: 1. No active disease. 2. Mild cardiomegaly. Electronically Signed   By: Elgie Collard M.D.   On: 10/28/2022 21:27               LOS: 4 days   Meleane Selinger  [facility]   Pager on [email or web address]. If 7PM-7AM, please contact night-coverage at [email or web address]     10/29/2022, 11:49 AM

## 2022-10-29 NOTE — Progress Notes (Signed)
Patient ID: Holly Simon, female   DOB: Jan 24, 1961, 62 y.o.   MRN: [id number]     SURGICAL PROGRESS NOTE   Hospital Day(s): 4.   Interval History: Patient seen and examined, no acute events or new complaints overnight. Patient reports feeling much better this morning.  She endorses that she passed 2 episode of gas yesterday and they were big.  She denies any nausea or vomiting.  She endorses that she was able to ambulate.  Vital signs in last 24 hours: [min-max] current  Temp:  [98.2 F (36.8 C)-100.2 F (37.9 C)] 98.2 F (36.8 C) (09/28 0415) Pulse Rate:  [78-80] 78 (09/28 0415) Resp:  [16-19] 16 (09/28 0415) BP: (131-142)/(52-60) 142/56 (09/28 0415) SpO2:  [98 %-100 %] 100 % (09/28 0415)     Height: 5' (152.4 cm) Weight: 129.3 kg BMI (Calculated): 55.66   Physical Exam:  Constitutional: alert, cooperative and no distress  Respiratory: breathing non-labored at rest  Cardiovascular: regular rate and sinus rhythm  Gastrointestinal: soft, non-tender, and non-distended  Labs:     Latest Ref Rng & Units 10/28/2022    6:58 AM 10/26/2022    3:46 AM 10/24/2022    9:56 PM  CBC  WBC 4.0 - 10.5 K/uL 7.7  7.9  14.3   Hemoglobin 12.0 - 15.0 g/dL 13.0  86.5  78.4   Hematocrit 36.0 - 46.0 % 31.9  37.7  41.7   Platelets 150 - 400 K/uL 201  221  196       Latest Ref Rng & Units 10/29/2022    5:16 AM 10/28/2022    6:58 AM 10/27/2022    5:51 AM  CMP  Glucose 70 - 99 mg/dL 696  295  284   BUN 8 - 23 mg/dL 36  33  27   Creatinine 0.44 - 1.00 mg/dL 1.32  4.40  1.02   Sodium 135 - 145 mmol/L 141  140  142   Potassium 3.5 - 5.1 mmol/L 3.2  4.2  4.1   Chloride 98 - 111 mmol/L 108  108  105   CO2 22 - 32 mmol/L 23  22  24    Calcium 8.9 - 10.3 mg/dL 7.9  7.8  8.7     Imaging studies: No new pertinent imaging studies   Assessment/Plan:  62 y.o. female with recurrent ventral hernia with obstruction 2 Day Post-Op s/p recurrent ventral hernia repair, complicated by pertinent comorbidities including  morbid obesity.   -Patient with adequate progress 2 days postop. -The pain is controlled -Patient started passing gas yesterday -Will clamp NGT and give clear liquid diet trial -May consider removing NGT if she continued passing gas during the day and no nausea with liquids -Encourage patient to ambulate -Continue DVT prophylaxis -Continue pain management  Gae Gallop, MD

## 2022-10-30 DIAGNOSIS — K56609 Unspecified intestinal obstruction, unspecified as to partial versus complete obstruction: Secondary | ICD-10-CM | POA: Diagnosis not present

## 2022-10-30 LAB — BASIC METABOLIC PANEL
Anion gap: 6 (ref 5–15)
BUN: 29 mg/dL — ABNORMAL HIGH (ref 8–23)
CO2: 25 mmol/L (ref 22–32)
Calcium: 7.7 mg/dL — ABNORMAL LOW (ref 8.9–10.3)
Chloride: 107 mmol/L (ref 98–111)
Creatinine, Ser: 0.63 mg/dL (ref 0.44–1.00)
GFR, Estimated: >60 mL/min (ref >60)
Glucose, Bld: 167 mg/dL — ABNORMAL HIGH (ref 70–99)
Potassium: 3.5 mmol/L (ref 3.5–5.1)
Sodium: 138 mmol/L (ref 135–145)

## 2022-10-30 LAB — GLUCOSE, CAPILLARY
Glucose-Capillary: 148 mg/dL — ABNORMAL HIGH (ref 70–99)
Glucose-Capillary: 143 mg/dL — ABNORMAL HIGH (ref 70–99)
Glucose-Capillary: 198 mg/dL — ABNORMAL HIGH (ref 70–99)
Glucose-Capillary: 154 mg/dL — ABNORMAL HIGH (ref 70–99)

## 2022-10-30 MED ORDER — POTASSIUM CHLORIDE CRYS ER 20 MEQ PO TBCR
40.0000 meq | EXTENDED_RELEASE_TABLET | Freq: Once | ORAL | Status: AC
  Administered 2022-10-30: 40 meq via ORAL
  Filled 2022-10-30: qty 2

## 2022-10-30 MED ORDER — PANTOPRAZOLE SODIUM 40 MG PO TBEC
40.0000 mg | DELAYED_RELEASE_TABLET | Freq: Every day | ORAL | Status: DC
  Administered 2022-10-31 – 2022-11-01 (×2): 40 mg via ORAL
  Filled 2022-10-30 (×2): qty 1

## 2022-10-30 NOTE — Plan of Care (Signed)

## 2022-10-30 NOTE — Progress Notes (Signed)
Patient ID: Holly Simon, female   DOB: 30-May-1960, 62 y.o.   MRN: [id number]     SURGICAL PROGRESS NOTE   Hospital Day(s): 5.   Interval History: Patient seen and examined, no acute events or new complaints overnight. Patient reports feeling better this morning. She endorses passing a lot of gas. Pain controlled.  Vital signs in last 24 hours: [min-max] current  Temp:  [98.2 F (36.8 C)-98.3 F (36.8 C)] 98.3 F (36.8 C) (09/29 0756) Pulse Rate:  [83-88] 83 (09/29 0756) Resp:  [16-18] 16 (09/29 0756) BP: (121-145)/(38-58) 121/38 (09/29 0756) SpO2:  [94 %-100 %] 94 % (09/29 0756)     Height: 5' (152.4 cm) Weight: 129.3 kg BMI (Calculated): 55.66   Physical Exam:  Constitutional: alert, cooperative and no distress  Respiratory: breathing non-labored at rest  Cardiovascular: regular rate and sinus rhythm  Gastrointestinal: soft, non-tender, and non-distended. Wounds are dry and clean.   Labs:     Latest Ref Rng & Units 10/28/2022    6:58 AM 10/26/2022    3:46 AM 10/24/2022    9:56 PM  CBC  WBC 4.0 - 10.5 K/uL 7.7  7.9  14.3   Hemoglobin 12.0 - 15.0 g/dL 78.2  95.6  21.3   Hematocrit 36.0 - 46.0 % 31.9  37.7  41.7   Platelets 150 - 400 K/uL 201  221  196       Latest Ref Rng & Units 10/30/2022    4:53 AM 10/29/2022    5:16 AM 10/28/2022    6:58 AM  CMP  Glucose 70 - 99 mg/dL 086  578  469   BUN 8 - 23 mg/dL 29  36  33   Creatinine 0.44 - 1.00 mg/dL 6.29  5.28  4.13   Sodium 135 - 145 mmol/L 138  141  140   Potassium 3.5 - 5.1 mmol/L 3.5  3.2  4.2   Chloride 98 - 111 mmol/L 107  108  108   CO2 22 - 32 mmol/L 25  23  22    Calcium 8.9 - 10.3 mg/dL 7.7  7.9  7.8     Imaging studies: No new pertinent imaging studies   Assessment/Plan:  62 y.o. female with recurrent ventral hernia with obstruction 3 Day Post-Op s/p recurrent ventral hernia repair, complicated by pertinent comorbidities including morbid obesity.   -Patient is recovering well -Continue passing gas -NGT  removed. Advance diet to full liquids.  -Continue pain management -Encourage to ambulate  Gae Gallop, MD

## 2022-10-30 NOTE — Progress Notes (Signed)
Progress Note    Holly Simon  UJW:[id number] DOB: 1960/08/23  DOA: 10/25/2022 PCP: Enid Baas, MD      Brief Narrative:    Medical records reviewed and are as summarized below:  Holly Simon is a 62 y.o. female  with medical history significant of large ventral wall hernia, recurrent SBO, HTN, IIDM, morbid obesity, who presented to the hospital with abdominal pain and constipation. She had seen a surgeon in the past for evaluation for ventral hernia repair.  However, she was told that she has to lose some weight before surgery will be considered.        Assessment/Plan:   Principal Problem:   SBO (small bowel obstruction) (HCC) Active Problems:   Small bowel obstruction (HCC)   Obesity, Class III, BMI 40-49.9 (morbid obesity) (HCC)   Body mass index is 55.66 kg/m.  (Morbid obesity): This complicates overall care and prognosis.   Small bowel obstruction, large incarcerated ventral hernia: S/p open ventral hernia repair with mesh on 10/27/2022.  Diet has been advanced to full liquid diet.  Analgesics as needed for pain.  Follow-up with general surgeon.   Leukocytosis: Improved.     Hypokalemia: Improved.  Continue potassium repletion.   Type II DM: Metformin, glimepiride and pioglitazone been held.  Continue NovoLog sliding scale as needed.   Hypertension: Lisinopril on hold.  IV hydralazine as needed for severe hypertension.   Abnormal EKG, sinus bradycardia with nonspecific ST-T changes: Troponins negative.  No chest pain.  Diet Order             Diet full liquid Fluid consistency: Thin  Diet effective now                            Consultants: General surgeon  Procedures: None    Medications:    enoxaparin (LOVENOX) injection  0.5 mg/kg Subcutaneous Q24H   gabapentin  300 mg Oral TID   insulin aspart  0-20 Units Subcutaneous TID WC   insulin aspart  0-5 Units Subcutaneous QHS   ketorolac  30 mg Intravenous Q8H    latanoprost  1 drop Both Eyes QHS   [START ON 10/31/2022] pantoprazole  40 mg Oral Daily   Continuous Infusions:  sodium chloride Stopped (10/28/22 2257)      Anti-infectives (From admission, onward)    None              Family Communication/Anticipated D/C date and plan/Code Status   DVT prophylaxis:      Code Status: Full Code  Family Communication: None Disposition Plan: Plan to discharge home   Status is: Inpatient Remains inpatient appropriate because: Small bowel obstruction       Subjective:   Interval events noted.  She feels much better today.  Abdominal pain is better.  No nausea or vomiting.  She tolerated clear liquid diet.  She is passing gas but no bowel movement.  Dr. Hazle Quant, surgeon, was at the bedside  Objective:    Vitals:   10/29/22 1546 10/29/22 2015 10/30/22 0459 10/30/22 0756  BP: (!) 123/58 (!) 127/57 (!) 145/50 (!) 121/38  Pulse: 87 88 88 83  Resp: 16 17 18 16   Temp: 98.2 F (36.8 C) 98.2 F (36.8 C) 98.2 F (36.8 C) 98.3 F (36.8 C)  TempSrc: Oral   Oral  SpO2: 97% 100% 96% 94%  Weight:      Height:  No data found.   Intake/Output Summary (Last 24 hours) at 10/30/2022 1215 Last data filed at 10/30/2022 1100 Gross per 24 hour  Intake 810 ml  Output 450 ml  Net 360 ml   Filed Weights   10/24/22 2154 10/27/22 1141  Weight: 129.3 kg 129.3 kg    Exam:  GEN: NAD SKIN: Warm and dry EYES: Anicteric ENT: MMM CV: RRR PULM: CTA B ABD: soft, ND, NT, +BS, + wound VAC on surgical incisional wound is intact CNS: AAO x 3, non focal EXT: No edema or tenderness      Data Reviewed:   I have personally reviewed following labs and imaging studies:  Labs: Labs show the following:   Basic Metabolic Panel: Recent Labs  Lab 10/26/22 0346 10/27/22 0551 10/28/22 0658 10/29/22 0516 10/30/22 0453  NA 136 142 140 141 138  K 4.1 4.1 4.2 3.2* 3.5  CL 103 105 108 108 107  CO2 23 24 22 23 25   GLUCOSE 232*  186* 226* 128* 167*  BUN 22 27* 33* 36* 29*  CREATININE 0.70 0.66 0.86 0.86 0.63  CALCIUM 8.8* 8.7* 7.8* 7.9* 7.7*  MG  --  1.8 1.7 1.7  --   PHOS  --  2.6 3.1  --   --    GFR Estimated Creatinine Clearance: 88.6 mL/min (by C-G formula based on SCr of 0.63 mg/dL). Liver Function Tests: Recent Labs  Lab 10/24/22 2156  AST 19  ALT 17  ALKPHOS 62  BILITOT 0.8  PROT 7.1  ALBUMIN 3.8   Recent Labs  Lab 10/24/22 2156  LIPASE 29   No results for input(s): "AMMONIA" in the last 168 hours. Coagulation profile No results for input(s): "INR", "PROTIME" in the last 168 hours.  CBC: Recent Labs  Lab 10/24/22 2156 10/26/22 0346 10/28/22 0658  WBC 14.3* 7.9 7.7  HGB 12.9 12.3 10.4*  HCT 41.7 37.7 31.9*  MCV 102.0* 95.9 94.9  PLT 196 221 201   Cardiac Enzymes: No results for input(s): "CKTOTAL", "CKMB", "CKMBINDEX", "TROPONINI" in the last 168 hours. BNP (last 3 results) No results for input(s): "PROBNP" in the last 8760 hours. CBG: Recent Labs  Lab 10/29/22 1122 10/29/22 1732 10/29/22 2128 10/30/22 0757 10/30/22 1147  GLUCAP 174* 148* 141* 148* 143*   D-Dimer: No results for input(s): "DDIMER" in the last 72 hours. Hgb A1c: No results for input(s): "HGBA1C" in the last 72 hours.  Lipid Profile: No results for input(s): "CHOL", "HDL", "LDLCALC", "TRIG", "CHOLHDL", "LDLDIRECT" in the last 72 hours. Thyroid function studies: No results for input(s): "TSH", "T4TOTAL", "T3FREE", "THYROIDAB" in the last 72 hours.  Invalid input(s): "FREET3" Anemia work up: No results for input(s): "VITAMINB12", "FOLATE", "FERRITIN", "TIBC", "IRON", "RETICCTPCT" in the last 72 hours. Sepsis Labs: Recent Labs  Lab 10/24/22 2156 10/25/22 0521 10/26/22 0346 10/28/22 0658  WBC 14.3*  --  7.9 7.7  LATICACIDVEN  --  1.2  --   --     Microbiology No results found for this or any previous visit (from the past 240 hour(s)).  Procedures and diagnostic studies:  DG Chest Port 1  View  Result Date: 10/28/2022 CLINICAL DATA:  Fever and cough. EXAM: PORTABLE CHEST 1 VIEW COMPARISON:  None Available. FINDINGS: Enteric tube extends below the diaphragm with poorly visualized tip, likely in the distal stomach or beyond the inferior margin of the image. No focal consolidation, pleural effusion, or pneumothorax. Mild cardiomegaly. No acute osseous pathology. IMPRESSION: 1. No active disease. 2. Mild cardiomegaly. Electronically  Signed   By: Elgie Collard M.D.   On: 10/28/2022 21:27               LOS: 5 days   Sheketa Ende  [facility]   Pager on [email or web address]. If 7PM-7AM, please contact night-coverage at [email or web address]     10/30/2022, 12:15 PM

## 2022-10-30 NOTE — Progress Notes (Signed)
PHARMACIST - PHYSICIAN COMMUNICATION  CONCERNING: IV to Oral Route Change Policy  RECOMMENDATION: This patient is receiving pantoprazole by the intravenous route.  Based on criteria approved by the Pharmacy and Therapeutics Committee, the intravenous medication(s) is/are being converted to the equivalent oral dose form(s).  DESCRIPTION: These criteria include: The patient is eating (either orally or via tube) and/or has been taking other orally administered medications for a least 24 hours The patient has no evidence of active gastrointestinal bleeding or impaired GI absorption (gastrectomy, short bowel, patient on TNA or NPO).  If you have questions about this conversion, please contact the Pharmacy Department   Tressie Ellis, [facility] 10/30/2022 9:40 AM

## 2022-10-31 DIAGNOSIS — K56609 Unspecified intestinal obstruction, unspecified as to partial versus complete obstruction: Secondary | ICD-10-CM | POA: Diagnosis not present

## 2022-10-31 LAB — GLUCOSE, CAPILLARY
Glucose-Capillary: 136 mg/dL — ABNORMAL HIGH (ref 70–99)
Glucose-Capillary: 134 mg/dL — ABNORMAL HIGH (ref 70–99)
Glucose-Capillary: 187 mg/dL — ABNORMAL HIGH (ref 70–99)
Glucose-Capillary: 152 mg/dL — ABNORMAL HIGH (ref 70–99)

## 2022-10-31 LAB — SURGICAL PATHOLOGY

## 2022-10-31 NOTE — Progress Notes (Signed)
Progress Note    SHETARA LAUNER  ZOX:[id number] DOB: 1960/12/25  DOA: 10/25/2022 PCP: Enid Baas, MD      Brief Narrative:    Medical records reviewed and are as summarized below:  Holly Simon is a 62 y.o. female  with medical history significant of large ventral wall hernia, recurrent SBO, HTN, IIDM, morbid obesity, who presented to the hospital with abdominal pain and constipation. She had seen a surgeon in the past for evaluation for ventral hernia repair.  However, she was told that she has to lose some weight before surgery will be considered.        Assessment/Plan:   Principal Problem:   SBO (small bowel obstruction) (HCC) Active Problems:   Small bowel obstruction (HCC)   Obesity, Class III, BMI 40-49.9 (morbid obesity) (HCC)   Body mass index is 55.66 kg/m.  (Morbid obesity): This complicates overall care and prognosis.   Small bowel obstruction, large incarcerated ventral hernia: S/p open ventral hernia repair with mesh on 10/27/2022.  Diet has been advanced to soft diet.  Analgesics as needed for pain.  Follow-up with general surgeon.   Leukocytosis: Improved.     Hypokalemia: Improved.    Type II DM: Metformin, glimepiride and pioglitazone been held.  Continue NovoLog sliding scale as needed.   Hypertension: Lisinopril on hold.  Will consider starting losartan tomorrow.   Abnormal EKG, sinus bradycardia with nonspecific ST-T changes: Troponins negative.  No chest pain.  Diet Order             DIET SOFT Fluid consistency: Thin  Diet effective now                            Consultants: General surgeon  Procedures: None    Medications:    enoxaparin (LOVENOX) injection  0.5 mg/kg Subcutaneous Q24H   gabapentin  300 mg Oral TID   insulin aspart  0-20 Units Subcutaneous TID WC   insulin aspart  0-5 Units Subcutaneous QHS   ketorolac  30 mg Intravenous Q8H   latanoprost  1 drop Both Eyes QHS   pantoprazole  40  mg Oral Daily   Continuous Infusions:  sodium chloride Stopped (10/28/22 2257)      Anti-infectives (From admission, onward)    None              Family Communication/Anticipated D/C date and plan/Code Status   DVT prophylaxis:      Code Status: Full Code  Family Communication: None Disposition Plan: Plan to discharge home   Status is: Inpatient Remains inpatient appropriate because: Small bowel obstruction       Subjective:   Interval events noted.  She is passing a lot of gas.  She has had a bowel movement.  Abdominal pain is better.  Objective:    Vitals:   10/30/22 1642 10/30/22 2044 10/31/22 0344 10/31/22 0845  BP: (!) 123/51 (!) 112/50 (!) 115/51 138/60  Pulse: 77 78 72 70  Resp: 16 18 18 17   Temp: 98.3 F (36.8 C) 98.3 F (36.8 C) 98.5 F (36.9 C) 98.5 F (36.9 C)  TempSrc:      SpO2: 97% 100% 96% 100%  Weight:      Height:       No data found.   Intake/Output Summary (Last 24 hours) at 10/31/2022 1407 Last data filed at 10/31/2022 1100 Gross per 24 hour  Intake 600 ml  Output --  Net 600 ml   Filed Weights   10/24/22 2154 10/27/22 1141  Weight: 129.3 kg 129.3 kg    Exam:  GEN: NAD, sitting up in the chair SKIN: Warm and dry EYES: No pallor or icterus ENT: MMM CV: RRR PULM: CTA B ABD: soft, obese, NT, +BS, + wound VAC is intact CNS: AAO x 3, non focal EXT: No edema or tenderness      Data Reviewed:   I have personally reviewed following labs and imaging studies:  Labs: Labs show the following:   Basic Metabolic Panel: Recent Labs  Lab 10/26/22 0346 10/27/22 0551 10/28/22 0658 10/29/22 0516 10/30/22 0453  NA 136 142 140 141 138  K 4.1 4.1 4.2 3.2* 3.5  CL 103 105 108 108 107  CO2 23 24 22 23 25   GLUCOSE 232* 186* 226* 128* 167*  BUN 22 27* 33* 36* 29*  CREATININE 0.70 0.66 0.86 0.86 0.63  CALCIUM 8.8* 8.7* 7.8* 7.9* 7.7*  MG  --  1.8 1.7 1.7  --   PHOS  --  2.6 3.1  --   --    GFR Estimated  Creatinine Clearance: 88.6 mL/min (by C-G formula based on SCr of 0.63 mg/dL). Liver Function Tests: Recent Labs  Lab 10/24/22 2156  AST 19  ALT 17  ALKPHOS 62  BILITOT 0.8  PROT 7.1  ALBUMIN 3.8   Recent Labs  Lab 10/24/22 2156  LIPASE 29   No results for input(s): "AMMONIA" in the last 168 hours. Coagulation profile No results for input(s): "INR", "PROTIME" in the last 168 hours.  CBC: Recent Labs  Lab 10/24/22 2156 10/26/22 0346 10/28/22 0658  WBC 14.3* 7.9 7.7  HGB 12.9 12.3 10.4*  HCT 41.7 37.7 31.9*  MCV 102.0* 95.9 94.9  PLT 196 221 201   Cardiac Enzymes: No results for input(s): "CKTOTAL", "CKMB", "CKMBINDEX", "TROPONINI" in the last 168 hours. BNP (last 3 results) No results for input(s): "PROBNP" in the last 8760 hours. CBG: Recent Labs  Lab 10/30/22 1147 10/30/22 1644 10/30/22 2133 10/31/22 0846 10/31/22 1155  GLUCAP 143* 198* 154* 136* 134*   D-Dimer: No results for input(s): "DDIMER" in the last 72 hours. Hgb A1c: No results for input(s): "HGBA1C" in the last 72 hours.  Lipid Profile: No results for input(s): "CHOL", "HDL", "LDLCALC", "TRIG", "CHOLHDL", "LDLDIRECT" in the last 72 hours. Thyroid function studies: No results for input(s): "TSH", "T4TOTAL", "T3FREE", "THYROIDAB" in the last 72 hours.  Invalid input(s): "FREET3" Anemia work up: No results for input(s): "VITAMINB12", "FOLATE", "FERRITIN", "TIBC", "IRON", "RETICCTPCT" in the last 72 hours. Sepsis Labs: Recent Labs  Lab 10/24/22 2156 10/25/22 0521 10/26/22 0346 10/28/22 0658  WBC 14.3*  --  7.9 7.7  LATICACIDVEN  --  1.2  --   --     Microbiology No results found for this or any previous visit (from the past 240 hour(s)).  Procedures and diagnostic studies:  No results found.             LOS: 6 days   Torsha Lemus  [facility]   Pager on [email or web address]. If 7PM-7AM, please contact night-coverage at [email or web address]     10/31/2022, 2:07 PM

## 2022-10-31 NOTE — Progress Notes (Signed)
Physical Therapy Treatment Patient Details Name: Holly Simon MRN: [id number] DOB: January 08, 1961 Today's Date: 10/31/2022   History of Present Illness Pt is a 62 yo female s/p repeat ventral hernia repair, noted for small bowel obstruction as well. PMH of HTN, DM, previous SBO.    PT Comments  Patient in recliner on arrival and agreeable to PT session. Patient complaining of 5/10 abdominal pain upon arrival, RN notified and in to provide pain medication prior to mobility. Patient able to complete 55' with RW and supervision. Reporting feeling more confident this session than previous attempts of ambulation. Educated patient on lying flat 2-3 times a day to improve incision mobility, patient verbalized understanding. Encouraged continued mobility with nursing staff and mobility specialists to improve strength and activity tolerance. Discharge plan remains appropriate.     If plan is discharge home, recommend the following: Assistance with cooking/housework;Assist for transportation;Help with stairs or ramp for entrance   Can travel by private vehicle        Equipment Recommendations  Rolling Debbie Bellucci (2 wheels)    Recommendations for Other Services       Precautions / Restrictions Precautions Precautions: Fall Precaution Comments: wound vac Restrictions Weight Bearing Restrictions: No     Mobility  Bed Mobility               General bed mobility comments: in recliner on arrival    Transfers Overall transfer level: Needs assistance Equipment used: Rolling Cathe Bilger (2 wheels) Transfers: Sit to/from Stand Sit to Stand: Supervision                Ambulation/Gait Ambulation/Gait assistance: Supervision Gait Distance (Feet): 540 Feet Assistive device: Rolling Baleria Wyman (2 wheels) Gait Pattern/deviations: Step-through pattern, Decreased stride length Gait velocity: decreased     General Gait Details: supervision for safety. Patient reports feeling good this  date   Stairs             Wheelchair Mobility     Tilt Bed    Modified Rankin (Stroke Patients Only)       Balance Overall balance assessment: Mild deficits observed, not formally tested                                          Cognition Arousal: Alert Behavior During Therapy: WFL for tasks assessed/performed Overall Cognitive Status: Within Functional Limits for tasks assessed                                          Exercises      General Comments        Pertinent Vitals/Pain Pain Assessment Pain Assessment: 0-10 Pain Score: 5  Pain Location: abdomen Pain Descriptors / Indicators: Aching, Sore Pain Intervention(s): Monitored during session, RN gave pain meds during session    Home Living                          Prior Function            PT Goals (current goals can now be found in the care plan section) Acute Rehab PT Goals Patient Stated Goal: to go home PT Goal Formulation: With patient Time For Goal Achievement: 11/11/22 Potential to Achieve Goals: Good Progress towards PT goals: Progressing toward goals  Frequency    Min 1X/week      PT Plan      Co-evaluation              AM-PAC PT "6 Clicks" Mobility   Outcome Measure  Help needed turning from your back to your side while in a flat bed without using bedrails?: A Little Help needed moving from lying on your back to sitting on the side of a flat bed without using bedrails?: A Little Help needed moving to and from a bed to a chair (including a wheelchair)?: A Little Help needed standing up from a chair using your arms (e.g., wheelchair or bedside chair)?: A Little Help needed to walk in hospital room?: A Little Help needed climbing 3-5 steps with a railing? : A Little 6 Click Score: 18    End of Session   Activity Tolerance: Patient tolerated treatment well Patient left: in chair;with call bell/phone within reach Nurse  Communication: Mobility status PT Visit Diagnosis: Other abnormalities of gait and mobility (R26.89)     Time: 1610-9604 PT Time Calculation (min) (ACUTE ONLY): 32 min  Charges:    $Therapeutic Activity: 23-37 mins PT General Charges $$ ACUTE PT VISIT: 1 Visit                     Maylon Peppers, PT, DPT Physical Therapist - [facility]  [facility]    Beryle Bagsby A Darcell Yacoub 10/31/2022, 10:01 AM

## 2022-10-31 NOTE — Progress Notes (Signed)
Occupational Therapy Treatment Patient Details Name: Holly Simon MRN: [id number] DOB: 07/08/1960 Today's Date: 10/31/2022   History of present illness Pt is a 62 yo female s/p repeat ventral hernia repair, noted for small bowel obstruction as well. PMH of HTN, DM, previous SBO.      OT comments  Pt seen for OT tx. Pt eager to take a bath. Pt set up for seated sponge bath and pt able to complete with only PRN MIN A for LB bathing and VC for techniques and AE/DME that could improve her independence and minimize discomfort with bending. Pt further instructed in AE and DME for ADL including bathing, toileting, dressing, and bed mobility/transfers  to minimize bending and discomfort/pain while recovering. Pt verbalized understanding and made plan to purchase some items in preparation for return home. Pt expressed appreciation. Pt seated in recliner with NT in room at end of session. Pt progressing well, continues to benefit from skilled OT Services while hospitalized.       If plan is discharge home, recommend the following:  A little help with bathing/dressing/bathroom;Help with stairs or ramp for entrance;Assist for transportation;Assistance with cooking/housework   Equipment Recommendations  Tub/shower seat (short bed rail, reacher, LH sponge, HH shower head, toileting aide)    Recommendations for Other Services      Precautions / Restrictions Precautions Precautions: Fall Precaution Comments: wound vac Restrictions Weight Bearing Restrictions: No       Mobility Bed Mobility               General bed mobility comments: NT, in recliner    Transfers Overall transfer level: Needs assistance Equipment used: Rolling walker (2 wheels) Transfers: Sit to/from Stand Sit to Stand: Supervision           General transfer comment: Supv for STS from recliner, SPT to/from EOB     Balance Overall balance assessment: Mild deficits observed, not formally tested                                          ADL either performed or assessed with clinical judgement   ADL Overall ADL's : Needs assistance/impaired     Grooming: Sitting;Modified independent   Upper Body Bathing: Sitting;Set up   Lower Body Bathing: Sit to/from stand;Set up;Supervison/ safety   Upper Body Dressing : Sitting;Modified independent   Lower Body Dressing: Sit to/from stand;Modified independent                      Extremity/Trunk Assessment              Vision       Perception     Praxis      Cognition Arousal: Alert Behavior During Therapy: WFL for tasks assessed/performed Overall Cognitive Status: Within Functional Limits for tasks assessed                                          Exercises Other Exercises Other Exercises: Pt further instructed in AE and DME for ADL including bathing, toileting, dressing, and bed mobility/transfers  to minimize bending and discomfort/pain while recovering.    Shoulder Instructions       General Comments      Pertinent Vitals/ Pain       Pain Assessment  Pain Assessment: 0-10 Pain Score: 3  Pain Location: abdomen Pain Descriptors / Indicators: Aching, Sore Pain Intervention(s): Monitored during session, Repositioned  Home Living                                          Prior Functioning/Environment              Frequency  Min 1X/week        Progress Toward Goals  OT Goals(current goals can now be found in the care plan section)  Progress towards OT goals: Progressing toward goals  Acute Rehab OT Goals Patient Stated Goal: return home Time For Goal Achievement: 11/11/22 Potential to Achieve Goals: Good  Plan      Co-evaluation                 AM-PAC OT "6 Clicks" Daily Activity     Outcome Measure   Help from another person eating meals?: None Help from another person taking care of personal grooming?: None Help from another person  toileting, which includes using toliet, bedpan, or urinal?: None Help from another person bathing (including washing, rinsing, drying)?: A Little Help from another person to put on and taking off regular upper body clothing?: None Help from another person to put on and taking off regular lower body clothing?: A Little 6 Click Score: 22    End of Session    OT Visit Diagnosis: Other abnormalities of gait and mobility (R26.89);Unsteadiness on feet (R26.81);Muscle weakness (generalized) (M62.81)   Activity Tolerance Patient tolerated treatment well   Patient Left with call bell/phone within reach;in chair   Nurse Communication          Time: 9678-9381 OT Time Calculation (min): 56 min  Charges: OT General Charges $OT Visit: 1 Visit OT Treatments $Self Care/Home Management : 53-67 mins  Arman Filter., MPH, MS, OTR/L ascom [phone] 10/31/22, 12:02 PM

## 2022-10-31 NOTE — Progress Notes (Signed)
Patient ID: Holly Simon, female   DOB: [birth year], 62 y.o.   MRN: [id number]     SURGICAL PROGRESS NOTE   Hospital Day(s): 6.   Interval History: Patient seen and examined, no acute events or new complaints overnight. Patient reports feeling much better.  She endorses she is passing gas and had a bowel movement.  She denies any significant abdominal pain.  Vital signs in last 24 hours: [min-max] current  Temp:  [98.3 F (36.8 C)-98.5 F (36.9 C)] 98.5 F (36.9 C) (09/30 1826) Pulse Rate:  [70-78] 72 (09/30 1826) Resp:  [17-18] 18 (09/30 1826) BP: (112-138)/(50-64) 134/64 (09/30 1826) SpO2:  [96 %-100 %] 100 % (09/30 1826)     Height: 5' (152.4 cm) Weight: 129.3 kg BMI (Calculated): 55.66   Physical Exam:  Constitutional: alert, cooperative and no distress  Respiratory: breathing non-labored at rest  Cardiovascular: regular rate and sinus rhythm  Gastrointestinal: soft, non-tender, and non-distended  Labs:     Latest Ref Rng & Units 10/28/2022    6:58 AM 10/26/2022    3:46 AM 10/24/2022    9:56 PM  CBC  WBC 4.0 - 10.5 K/uL 7.7  7.9  14.3   Hemoglobin 12.0 - 15.0 g/dL 40.9  81.1  91.4   Hematocrit 36.0 - 46.0 % 31.9  37.7  41.7   Platelets 150 - 400 K/uL 201  221  196       Latest Ref Rng & Units 10/30/2022    4:53 AM 10/29/2022    5:16 AM 10/28/2022    6:58 AM  CMP  Glucose 70 - 99 mg/dL 782  956  213   BUN 8 - 23 mg/dL 29  36  33   Creatinine 0.44 - 1.00 mg/dL 0.86  5.78  4.69   Sodium 135 - 145 mmol/L 138  141  140   Potassium 3.5 - 5.1 mmol/L 3.5  3.2  4.2   Chloride 98 - 111 mmol/L 107  108  108   CO2 22 - 32 mmol/L 25  23  22    Calcium 8.9 - 10.3 mg/dL 7.7  7.9  7.8     Imaging studies: No new pertinent imaging studies   Assessment/Plan:  62 y.o. female with recurrent ventral hernia with obstruction 4 Day Post-Op s/p recurrent ventral hernia repair, complicated by pertinent comorbidities including morbid obesity.   -Patient continue adequate recovery -She  continue passing gas and had a bowel movement -Pain control -Diet advance to soft diet and seems to be doing well -Hopefully she will be able to go home tomorrow if no clinical deterioration.  Gae Gallop, MD

## 2022-11-01 DIAGNOSIS — K56609 Unspecified intestinal obstruction, unspecified as to partial versus complete obstruction: Secondary | ICD-10-CM | POA: Diagnosis not present

## 2022-11-01 LAB — GLUCOSE, CAPILLARY
Glucose-Capillary: 159 mg/dL — ABNORMAL HIGH (ref 70–99)
Glucose-Capillary: 128 mg/dL — ABNORMAL HIGH (ref 70–99)

## 2022-11-01 MED ORDER — HYDROCODONE-ACETAMINOPHEN 5-325 MG PO TABS: 1.0000 | ORAL_TABLET | ORAL | 0 refills | Status: AC | PRN

## 2022-11-01 NOTE — Plan of Care (Signed)

## 2022-11-01 NOTE — Discharge Instructions (Signed)
  Diet: Resume home heart healthy regular diet.   Activity: No heavy lifting >10 pounds (children, pets, laundry, garbage) or strenuous activity until follow-up, but light activity and walking are encouraged. Do not drive or drink alcohol if taking narcotic pain medications.  Wound care: May shower with soapy water and pat dry (do not rub incisions), but no baths or submerging incision underwater until follow-up. (no swimming)   Medications: Resume all home medications. For mild to moderate pain: acetaminophen (Tylenol) or ibuprofen (if no kidney disease). Combining Tylenol with alcohol can substantially increase your risk of causing liver disease. Narcotic pain medications, if prescribed, can be used for severe pain, though may cause nausea, constipation, and drowsiness. Do not combine Tylenol and Norco within a 6 hour period as Norco contains Tylenol. If you do not need the narcotic pain medication, you do not need to fill the prescription.  Call office [phone]) at any time if any questions, worsening pain, fevers/chills, bleeding, drainage from incision site, or other concerns.

## 2022-11-01 NOTE — Progress Notes (Signed)
Occupational Therapy Treatment Patient Details Name: Holly Simon MRN: [id number] DOB: 26-Nov-1960 Today's Date: 11/01/2022   History of present illness Pt is a 62 yo female s/p repeat ventral hernia repair, noted for small bowel obstruction as well. PMH of HTN, DM, previous SBO.   OT comments  Pt seen for OT tx. Pt eager to participate and endorsing having questions prior to discharge later today. OT facilitated problem solving and answered pt's questions about how to participate in ADL and IADL while minimizing risk of over exertion and abdominal pain/discomfort. Pt educated in how to incorporate learned strategies into routines. Also instructed in BLE exercises to perform during routine tasks. Pt verbalized understanding. Appreciative of instruction. Pt progressing well.       If plan is discharge home, recommend the following:  A little help with bathing/dressing/bathroom;Help with stairs or ramp for entrance;Assist for transportation;Assistance with cooking/housework   Equipment Recommendations  Tub/shower seat;Other (comment) (short bed rail, reacher, LH sponge, HH shower head, toileting aide)    Recommendations for Other Services      Precautions / Restrictions Precautions Precautions: Fall Precaution Comments: wound vac Restrictions Weight Bearing Restrictions: No       Mobility Bed Mobility               General bed mobility comments: NT seated EOB at start and end of session    Transfers Overall transfer level: Needs assistance Equipment used: None Transfers: Sit to/from Stand Sit to Stand: Supervision                 Balance Overall balance assessment: Mild deficits observed, not formally tested                                         ADL either performed or assessed with clinical judgement   ADL                                              Extremity/Trunk Assessment              Vision        Perception     Praxis      Cognition Arousal: Alert Behavior During Therapy: WFL for tasks assessed/performed Overall Cognitive Status: Within Functional Limits for tasks assessed                                          Exercises Other Exercises Other Exercises: OT facilitated problem solving and answered pt's questions about how to participate in ADL and IADL while minimizing risk of over exertion and abdominal pain/discomfort. Pt educated in how to incorporate learned strategies into routines. Also instructed in BLE exercises to perform during routine tasks.    Shoulder Instructions       General Comments      Pertinent Vitals/ Pain       Pain Assessment Pain Assessment: 0-10 Pain Score: 3  Pain Location: abdomen Pain Descriptors / Indicators: Aching, Sore Pain Intervention(s): Monitored during session, Repositioned  Home Living  Prior Functioning/Environment              Frequency  Min 1X/week        Progress Toward Goals  OT Goals(current goals can now be found in the care plan section)  Progress towards OT goals: Progressing toward goals  Acute Rehab OT Goals Patient Stated Goal: g ohome Time For Goal Achievement: 11/11/22 Potential to Achieve Goals: Good  Plan      Co-evaluation                 AM-PAC OT "6 Clicks" Daily Activity     Outcome Measure   Help from another person eating meals?: None Help from another person taking care of personal grooming?: None Help from another person toileting, which includes using toliet, bedpan, or urinal?: None Help from another person bathing (including washing, rinsing, drying)?: A Little Help from another person to put on and taking off regular upper body clothing?: None Help from another person to put on and taking off regular lower body clothing?: A Little 6 Click Score: 22    End of Session    OT Visit Diagnosis:  Other abnormalities of gait and mobility (R26.89);Unsteadiness on feet (R26.81);Muscle weakness (generalized) (M62.81)   Activity Tolerance Patient tolerated treatment well   Patient Left in bed;with call bell/phone within reach   Nurse Communication          Time: 1610-9604 OT Time Calculation (min): 25 min  Charges: OT General Charges $OT Visit: 1 Visit OT Treatments $Self Care/Home Management : 23-37 mins  Arman Filter., MPH, MS, OTR/L ascom [phone] 11/01/22, 12:49 PM

## 2022-11-01 NOTE — Progress Notes (Signed)
Mobility Specialist - Progress Note   11/01/22 1122  Mobility  Activity Ambulated independently in room;Dangled on edge of bed  Level of Assistance Independent  Assistive Device None  Distance Ambulated (ft) 20 ft  Range of Motion/Exercises Active;Right leg;Left leg  Activity Response Tolerated well  $Mobility charge 1 Mobility  Mobility Specialist Start Time (ACUTE ONLY) 1100  Mobility Specialist Stop Time (ACUTE ONLY) 1120  Mobility Specialist Time Calculation (min) (ACUTE ONLY) 20 min   Pt supine upon entry, utilizing RA. Pt agreeable to therex this date. Pt dangled EOB and completed a set of seated therex; marches x10, leg Kicks x3, adductor pillow squeezes x10, ankle pumps and circles for 10 seconds each. Pt expressed some "pain and pulling" in abd during marches, stating " I feel pulling when lifting my legs". Pt left amb around the room indep gathering items amid d/c. RN notified.  Zetta Bills Mobility Specialist 11/01/22 11:32 AM

## 2022-11-01 NOTE — Progress Notes (Signed)
Patient ID: Holly Simon, female   DOB: 09-30-1960, 62 y.o.   MRN: [id number]     SURGICAL PROGRESS NOTE   Hospital Day(s): 7.   Interval History: Patient seen and examined, no acute events or new complaints overnight. Patient reports feeling okay this morning.  She denies any significant abdominal pain.  She endorses continued passing gas.  She denies any nausea or vomiting.  She endorses tolerating soft diet.  Vital signs in last 24 hours: [min-max] current  Temp:  [98.3 F (36.8 C)-98.5 F (36.9 C)] 98.3 F (36.8 C) (10/01 0128) Pulse Rate:  [70-77] 77 (10/01 0128) Resp:  [16-18] 16 (10/01 0128) BP: (123-138)/(55-64) 123/55 (10/01 0128) SpO2:  [96 %-100 %] 96 % (10/01 0128)     Height: 5' (152.4 cm) Weight: 129.3 kg BMI (Calculated): 55.66   Physical Exam:  Constitutional: alert, cooperative and no distress  Respiratory: breathing non-labored at rest  Cardiovascular: regular rate and sinus rhythm  Gastrointestinal: soft, non-tender, and non-distended.  The wound dry and clean  Labs:     Latest Ref Rng & Units 10/28/2022    6:58 AM 10/26/2022    3:46 AM 10/24/2022    9:56 PM  CBC  WBC 4.0 - 10.5 K/uL 7.7  7.9  14.3   Hemoglobin 12.0 - 15.0 g/dL 40.9  81.1  91.4   Hematocrit 36.0 - 46.0 % 31.9  37.7  41.7   Platelets 150 - 400 K/uL 201  221  196       Latest Ref Rng & Units 10/30/2022    4:53 AM 10/29/2022    5:16 AM 10/28/2022    6:58 AM  CMP  Glucose 70 - 99 mg/dL 782  956  213   BUN 8 - 23 mg/dL 29  36  33   Creatinine 0.44 - 1.00 mg/dL 0.86  5.78  4.69   Sodium 135 - 145 mmol/L 138  141  140   Potassium 3.5 - 5.1 mmol/L 3.5  3.2  4.2   Chloride 98 - 111 mmol/L 107  108  108   CO2 22 - 32 mmol/L 25  23  22    Calcium 8.9 - 10.3 mg/dL 7.7  7.9  7.8     Imaging studies: No new pertinent imaging studies   Assessment/Plan:  62 y.o. female with recurrent ventral hernia with obstruction 5 Day Post-Op s/p recurrent ventral hernia repair, complicated by pertinent  comorbidities including morbid obesity.   -Patient continue adequate recovery -Prevena removed today.  Wound is dry and clean -Patient tolerated soft diet.  Continue passing gas.  Adequate GI function. -Pain controlled -Patient ambulating well -No contraindication to discharge from surgical standpoint.  Pain medication prescription sent to pharmacy. -I will see patient in my office next week for a follow-up  Gae Gallop, MD

## 2022-11-01 NOTE — Plan of Care (Signed)
[facility]. Cornell Barman Royann Wildasin

## 2022-11-01 NOTE — Progress Notes (Signed)
IV removed, Discharge education completed. Patient wheeled to the medical mall exit by volunteers in stable condition to be discharged to the care of her son.  Cornell Barman [name]

## 2022-11-01 NOTE — Discharge Summary (Signed)
Physician Discharge Summary   Patient: Holly Simon MRN: [id number] DOB: [birth year]  Admit date:     10/25/2022  Discharge date: 11/01/22  Discharge Physician: Lurene Shadow   PCP: Enid Baas, MD   Recommendations at discharge:   Follow-up with Dr. Hazle Quant, general surgeon, on 11/10/2022 as scheduled  Discharge Diagnoses: Principal Problem:   SBO (small bowel obstruction) (HCC) Active Problems:   Small bowel obstruction (HCC)   Obesity, Class III, BMI 40-49.9 (morbid obesity) (HCC)  Resolved Problems:   * No resolved hospital problems. [facility] Course:  Holly Simon is a 62 y.o. female  with medical history significant of large ventral wall hernia, recurrent SBO, HTN, IIDM, morbid obesity, who presented to the hospital with abdominal pain and constipation. She had seen a surgeon in the past for evaluation for ventral hernia repair.  However, she was told that she has to lose some weight before surgery will be considered.      Assessment and Plan:  Small bowel obstruction, large incarcerated ventral hernia: S/p open ventral hernia repair with mesh on 10/27/2022.  She is tolerating a soft diet without any problems.  She has been cleared for discharge by general surgeon.   Leukocytosis: Improved.       Hypokalemia: Improved.      Type II DM: Resume metformin, glimepiride and pioglitazone at discharge.     Hypertension: Resume lisinopril at discharge     Abnormal EKG, sinus bradycardia with nonspecific ST-T changes: Troponins negative.  No chest pain.    Her condition is improved and she is deemed stable for discharge to home today.      Pain control - [employer] Controlled Substance Reporting System database was reviewed. and patient was instructed, not to drive, operate heavy machinery, perform activities at heights, swimming or participation in water activities or provide baby-sitting services while on Pain, Sleep and Anxiety Medications; until  their outpatient Physician has advised to do so again. Also recommended to not to take more than prescribed Pain, Sleep and Anxiety Medications.  Consultants: General Surgeon Procedures performed: S/p open ventral hernia repair with mesh on 10/27/2022   Disposition: Home Diet recommendation:  Discharge Diet Orders (From admission, onward)     Start     Ordered   11/01/22 0000  Diet - low sodium heart healthy        11/01/22 0937           Cardiac and Carb modified diet DISCHARGE MEDICATION: Allergies as of 11/01/2022       Reactions   Peanut-containing Drug Products Nausea And Vomiting, Other (See Comments)   Bloating, Abdominal Pain.        Medication List     TAKE these medications    Enstilar 0.005-0.064 % Foam Generic drug: Calcipotriene-Betameth Diprop Apply to aa's psoriasis BID x 2 weeks. Then decrease use to QD PRN. Avoid f/g/a.   HYDROcodone-acetaminophen 5-325 MG tablet Commonly known as: Norco Take 1 tablet by mouth every 4 (four) hours as needed for up to 3 days for moderate pain.   hydrocortisone 2.5 % lotion Apply to aa's face QD on Monday, Wednesday, and Friday as needed.   latanoprost 0.005 % ophthalmic solution Commonly known as: XALATAN Place 1 drop into both eyes at bedtime.   lisinopril 20 MG tablet Commonly known as: ZESTRIL Take 20 mg by mouth daily.   magnesium oxide 400 MG tablet Commonly known as: MAG-OX Take 400 mg by mouth daily. What changed: Another medication  with the same name was removed. Continue taking this medication, and follow the directions you see here.   metFORMIN 500 MG tablet Commonly known as: GLUCOPHAGE Take 500 mg by mouth 2 (two) times daily.   pioglitazone 45 MG tablet Commonly known as: ACTOS Take 45 mg by mouth daily.   Potassium 99 MG Tabs Take 1 tablet by mouth daily.   Trulicity 1.5 MG/0.5ML Sopn Generic drug: Dulaglutide Inject 1.5 mg into the skin once a week.               Discharge  Care Instructions  (From admission, onward)           Start     Ordered   11/01/22 0000  Discharge wound care:       Comments: Follow surgeon's instructions   11/01/22 1610            Follow-up Information     Carolan Shiver, MD Follow up on 11/10/2022.   Specialty: General Surgery Why: Follow up after hernia repair surgery Contact information: [address] [place] [address] [phone]                Discharge Exam: Filed Weights   10/24/22 2154 10/27/22 1141  Weight: 129.3 kg 129.3 kg   GEN: NAD SKIN: Warm and dry EYES: No pallor or icterus ENT: MMM CV: RRR PULM: CTA B ABD: soft, obese, NT, +BS, surgical incisional wound with staples.  Wound is clean, dry and intact. CNS: AAO x 3, non focal EXT: No edema or tenderness   Condition at discharge: good  The results of significant diagnostics from this hospitalization (including imaging, microbiology, ancillary and laboratory) are listed below for reference.   Imaging Studies: DG Chest Port 1 View  Result Date: 10/28/2022 CLINICAL DATA:  Fever and cough. EXAM: PORTABLE CHEST 1 VIEW COMPARISON:  None Available. FINDINGS: Enteric tube extends below the diaphragm with poorly visualized tip, likely in the distal stomach or beyond the inferior margin of the image. No focal consolidation, pleural effusion, or pneumothorax. Mild cardiomegaly. No acute osseous pathology. IMPRESSION: 1. No active disease. 2. Mild cardiomegaly. Electronically Signed   By: Elgie Collard M.D.   On: 10/28/2022 21:27   DG Abd Portable 1V-Small Bowel Obstruction Protocol-initial, 8 hr delay  Result Date: 10/26/2022 CLINICAL DATA:  Small-bowel obstruction, EXAM: PORTABLE ABDOMEN - 1 VIEW COMPARISON:  None Available. FINDINGS: Normal radiographs are obtained 8 hours following nasogastric administration of oral contrast. Contrast is seen filling multiple dilated loops of small bowel in keeping with a mid to distal  small bowel obstruction. There is no definite extension of contrast into ascending colon. No free intraperitoneal gas. No organomegaly. Osseous structures are age-appropriate. IMPRESSION: 1. Persistent mid to distal small bowel obstruction. Electronically Signed   By: Helyn Numbers M.D.   On: 10/26/2022 01:44   DG Abdomen 1 View  Result Date: 10/25/2022 CLINICAL DATA:  Post NG placement EXAM: ABDOMEN - 1 VIEW COMPARISON:  07/14/2019. FINDINGS: The bowel gas pattern is non-obstructive. No evidence of pneumoperitoneum. No acute osseous abnormalities. Visualized bilateral lungs are within normal limits. No consolidation, lung mass or pleural effusion. Surgical changes, devices, tubes and lines: Interval placement of nasogastric tube with its tip and side hole overlying the right upper abdomen, within the distal stomach. IMPRESSION: Interval placement of nasogastric tube with its tip and side hole overlying the right upper abdomen, within the distal stomach. Electronically Signed   By: Jules Schick M.D.   On:  10/25/2022 10:54   CT ABDOMEN PELVIS W CONTRAST  Result Date: 10/25/2022 CLINICAL DATA:  Evaluate for bowel obstruction. EXAM: CT ABDOMEN AND PELVIS WITH CONTRAST TECHNIQUE: Multidetector CT imaging of the abdomen and pelvis was performed using the standard protocol following bolus administration of intravenous contrast. RADIATION DOSE REDUCTION: This exam was performed according to the departmental dose-optimization program which includes automated exposure control, adjustment of the mA and/or kV according to patient size and/or use of iterative reconstruction technique. CONTRAST:  OMNIPAQUE IOHEXOL 350 MG/ML SOLN COMPARISON:  07/12/2019 FINDINGS: Lower chest: No acute abnormality. Hepatobiliary: No suspicious liver abnormality. Small stones within the dependent portion of the gallbladder are identified measuring up to 5 mm. No gallbladder wall thickening or inflammation. Pancreas: Unremarkable.  No pancreatic ductal dilatation or surrounding inflammatory changes. Spleen: Normal in size without focal abnormality. Adrenals/Urinary Tract: Adrenal glands are unremarkable. Kidneys are normal, without renal calculi, focal lesion, or hydronephrosis. Bladder is unremarkable. Stomach/Bowel: Small hiatal hernia. Stomach is nondistended. Multiple dilated loops of mid and distal small bowel are identified with scattered air-fluid levels. Small bowel loops measure up to 3.7 cm in diameter. Imaging findings are compatible with a small-bowel obstruction secondary to large anterior abdominal wall hernia. The transition to decreased caliber distal small bowel is noted within the left side of the hernia, image 73/2. There is fluid and soft tissue stranding surrounding the herniated bowel loops suggesting a degree of incarceration, similar in appearance to the previous exam. Normal caliber of the colon. The appendix is visualized and appears normal. Vascular/Lymphatic: Aortic atherosclerosis without aneurysm. No abdominopelvic adenopathy. Reproductive: Uterus and bilateral adnexa are unremarkable. Other: Free fluid identified within the ventral abdominal wall hernia. No focal fluid collections. No signs of pneumoperitoneum. Large ventral abdominal wall hernia as above. Musculoskeletal: No acute or suspicious osseous findings. IMPRESSION: 1. Small-bowel obstruction secondary to large ventral abdominal wall hernia. Transition to decreased caliber distal small bowel is noted within the left side of the hernia. There is fluid and soft tissue stranding surrounding the herniated bowel loops suggesting a degree of incarceration, similar in appearance to the previous exam. 2. Cholelithiasis without evidence of acute cholecystitis. 3. Aortic Atherosclerosis (ICD10-I70.0). Electronically Signed   By: Signa Kell M.D.   On: 10/25/2022 07:08    Microbiology: Results for orders placed or performed during the hospital encounter of  07/12/19  SARS Coronavirus 2 by RT PCR (hospital order, performed in [facility] hospital lab) Nasopharyngeal Nasopharyngeal Swab     Status: None   Collection Time: 07/12/19  9:12 PM   Specimen: Nasopharyngeal Swab  Result Value Ref Range Status   SARS Coronavirus 2 NEGATIVE NEGATIVE Final    Comment: (NOTE) SARS-CoV-2 target nucleic acids are NOT DETECTED.  The SARS-CoV-2 RNA is generally detectable in upper and lower respiratory specimens during the acute phase of infection. The lowest concentration of SARS-CoV-2 viral copies this assay can detect is 250 copies / mL. A negative result does not preclude SARS-CoV-2 infection and should not be used as the sole basis for treatment or other patient management decisions.  A negative result may occur with improper specimen collection / handling, submission of specimen other than nasopharyngeal swab, presence of viral mutation(s) within the areas targeted by this assay, and inadequate number of viral copies (<250 copies / mL). A negative result must be combined with clinical observations, patient history, and epidemiological information.  Fact Sheet for Patients:   BoilerBrush.com.cy  Fact Sheet for Healthcare Providers: https://pope.com/  This test is  not yet approved or  cleared by the [place] and has been authorized for detection and/or diagnosis of SARS-CoV-2 by FDA under an Emergency Use Authorization (EUA).  This EUA will remain in effect (meaning this test can be used) for the duration of the COVID-19 declaration under Section 564(b)(1) of the Act, 21 U.S.C. section 360bbb-3(b)(1), unless the authorization is terminated or revoked sooner.  Performed at [facility], [address] [place], [place] [address]     Labs: CBC: Recent Labs  Lab 10/26/22 0346 10/28/22 0658  WBC 7.9 7.7  HGB 12.3 10.4*  HCT 37.7 31.9*  MCV 95.9 94.9  PLT 221 201   Basic  Metabolic Panel: Recent Labs  Lab 10/26/22 0346 10/27/22 0551 10/28/22 0658 10/29/22 0516 10/30/22 0453  NA 136 142 140 141 138  K 4.1 4.1 4.2 3.2* 3.5  CL 103 105 108 108 107  CO2 23 24 22 23 25   GLUCOSE 232* 186* 226* 128* 167*  BUN 22 27* 33* 36* 29*  CREATININE 0.70 0.66 0.86 0.86 0.63  CALCIUM 8.8* 8.7* 7.8* 7.9* 7.7*  MG  --  1.8 1.7 1.7  --   PHOS  --  2.6 3.1  --   --    Liver Function Tests: No results for input(s): "AST", "ALT", "ALKPHOS", "BILITOT", "PROT", "ALBUMIN" in the last 168 hours. CBG: Recent Labs  Lab 10/31/22 0846 10/31/22 1155 10/31/22 1728 10/31/22 2148 11/01/22 0925  GLUCAP 136* 134* 187* 152* 159*    Discharge time spent: greater than 30 minutes.  Signed: Lurene Shadow, MD [facility] 11/01/2022

## 2022-12-26 ENCOUNTER — Encounter: Payer: Self-pay | Admitting: *Deleted

## 2023-01-02 ENCOUNTER — Encounter: Payer: Self-pay | Admitting: *Deleted

## 2023-01-03 ENCOUNTER — Ambulatory Visit: Payer: BC Managed Care – PPO | Admitting: Anesthesiology

## 2023-01-03 ENCOUNTER — Other Ambulatory Visit: Payer: Self-pay

## 2023-01-03 ENCOUNTER — Encounter
Admission: RE | Disposition: A | Discharge status: Home / Self Care | Payer: Self-pay | Source: Home / Self Care | Attending: Gastroenterology

## 2023-01-03 ENCOUNTER — Ambulatory Visit
Admission: RE | Admit: 2023-01-03 | Discharge: 2023-01-03 | Disposition: A | Discharge status: Home / Self Care | Payer: BC Managed Care – PPO | Attending: Gastroenterology | Admitting: Gastroenterology

## 2023-01-03 ENCOUNTER — Encounter: Payer: Self-pay | Admitting: *Deleted

## 2023-01-03 DIAGNOSIS — Z7985 Long-term (current) use of injectable non-insulin antidiabetic drugs: Secondary | ICD-10-CM | POA: Insufficient documentation

## 2023-01-03 DIAGNOSIS — K640 First degree hemorrhoids: Secondary | ICD-10-CM | POA: Insufficient documentation

## 2023-01-03 DIAGNOSIS — I10 Essential (primary) hypertension: Secondary | ICD-10-CM | POA: Diagnosis not present

## 2023-01-03 DIAGNOSIS — Z6841 Body Mass Index (BMI) 40.0 and over, adult: Secondary | ICD-10-CM | POA: Insufficient documentation

## 2023-01-03 DIAGNOSIS — Z800 Family history of malignant neoplasm of digestive organs: Secondary | ICD-10-CM | POA: Insufficient documentation

## 2023-01-03 DIAGNOSIS — Z9889 Other specified postprocedural states: Secondary | ICD-10-CM | POA: Diagnosis not present

## 2023-01-03 DIAGNOSIS — E119 Type 2 diabetes mellitus without complications: Secondary | ICD-10-CM | POA: Insufficient documentation

## 2023-01-03 DIAGNOSIS — Z1211 Encounter for screening for malignant neoplasm of colon: Secondary | ICD-10-CM | POA: Insufficient documentation

## 2023-01-03 DIAGNOSIS — Z7984 Long term (current) use of oral hypoglycemic drugs: Secondary | ICD-10-CM | POA: Insufficient documentation

## 2023-01-03 DIAGNOSIS — Z8719 Personal history of other diseases of the digestive system: Secondary | ICD-10-CM | POA: Insufficient documentation

## 2023-01-03 HISTORY — PX: COLONOSCOPY WITH PROPOFOL: SHX5780

## 2023-01-03 LAB — GLUCOSE, CAPILLARY: Glucose-Capillary: 119 mg/dL — ABNORMAL HIGH (ref 70–99)

## 2023-01-03 SURGERY — COLONOSCOPY WITH PROPOFOL
Anesthesia: General

## 2023-01-03 MED ORDER — LIDOCAINE HCL (CARDIAC) PF 100 MG/5ML IV SOSY
PREFILLED_SYRINGE | INTRAVENOUS | Status: DC | PRN
  Administered 2023-01-03: 100 mg via INTRAVENOUS

## 2023-01-03 MED ORDER — SODIUM CHLORIDE 0.9 % IV SOLN: INTRAVENOUS | Status: DC

## 2023-01-03 MED ORDER — PROPOFOL 1000 MG/100ML IV EMUL
INTRAVENOUS | Status: AC
  Filled 2023-01-03: qty 400

## 2023-01-03 MED ORDER — GLYCOPYRROLATE 0.2 MG/ML IJ SOLN
INTRAMUSCULAR | Status: DC | PRN
  Administered 2023-01-03: .2 mg via INTRAVENOUS

## 2023-01-03 MED ORDER — PROPOFOL 500 MG/50ML IV EMUL
INTRAVENOUS | Status: DC | PRN
  Administered 2023-01-03: 165 ug/kg/min via INTRAVENOUS

## 2023-01-03 MED ORDER — DEXMEDETOMIDINE HCL IN NACL 200 MCG/50ML IV SOLN
INTRAVENOUS | Status: DC | PRN
Start: 2023-01-03 — End: 2023-01-03
  Administered 2023-01-03: 8 ug via INTRAVENOUS

## 2023-01-03 MED ORDER — PROPOFOL 10 MG/ML IV BOLUS
INTRAVENOUS | Status: DC | PRN
  Administered 2023-01-03: 60 mg via INTRAVENOUS

## 2023-01-03 NOTE — Anesthesia Procedure Notes (Signed)
Procedure Name: General with mask airway Date/Time: 01/03/2023 7:44 AM  Performed by: Mohammed Kindle, CRNAPre-anesthesia Checklist: Patient identified, Emergency Drugs available, Suction available and Patient being monitored Patient Re-evaluated:Patient Re-evaluated prior to induction Oxygen Delivery Method: Simple face mask Induction Type: IV induction Placement Confirmation: positive ETCO2, breath sounds checked- equal and bilateral and CO2 detector

## 2023-01-03 NOTE — H&P (Signed)
Outpatient short stay form Pre-procedure 01/03/2023  Regis Bill, MD  Primary Physician: Enid Baas, MD  Reason for visit:  Screening  History of present illness:    62 y/o lady with history of DM II, hypertension, and SBO s/p surgical resection here for screening colonoscopy. Had a normal colonoscopy in 2011 that was normal and negative cologuard in 2021. Brother was diagnosed with colon cancer recently which led to scheduling colonoscopy.    Current Facility-Administered Medications:    0.9 %  sodium chloride infusion, , Intravenous, Continuous, Antonio Creswell, Rossie Muskrat, MD, Last Rate: 20 mL/hr at 01/03/23 0729, New Bag at 01/03/23 0729  Medications Prior to Admission  Medication Sig Dispense Refill Last Dose   liothyronine (CYTOMEL) 5 MCG tablet Take 5 mcg by mouth daily.   01/03/2023   lisinopril (ZESTRIL) 20 MG tablet Take 20 mg by mouth daily.   01/03/2023   Calcipotriene-Betameth Diprop (ENSTILAR) 0.005-0.064 % FOAM Apply to aa's psoriasis BID x 2 weeks. Then decrease use to QD PRN. Avoid f/g/a. 60 g 3    hydrocortisone 2.5 % lotion Apply to aa's face QD on Monday, Wednesday, and Friday as needed. 59 mL 3    latanoprost (XALATAN) 0.005 % ophthalmic solution Place 1 drop into both eyes at bedtime.      magnesium oxide (MAG-OX) 400 MG tablet Take 400 mg by mouth daily.      metFORMIN (GLUCOPHAGE) 500 MG tablet Take 500 mg by mouth 2 (two) times daily.   01/01/2023   pioglitazone (ACTOS) 45 MG tablet Take 45 mg by mouth daily.   01/01/2023   Potassium 99 MG TABS Take 1 tablet by mouth daily.      TRULICITY 1.5 MG/0.5ML SOPN Inject 1.5 mg into the skin once a week.   12/25/2022     Allergies  Allergen Reactions   Peanut-Containing Drug Products Nausea And Vomiting and Other (See Comments)    Bloating, Abdominal Pain.     Past Medical History:  Diagnosis Date   Bowel obstruction (HCC)    Diabetes mellitus without complication (HCC)    Hypertension    Open-angle  glaucoma    Strabismus     Review of systems:  Otherwise negative.    Physical Exam  Gen: Alert, oriented. Appears stated age.  HEENT: PERRLA. Lungs: No respiratory distress CV: RRR Abd: soft, benign, no masses Ext: No edema    Planned procedures: Proceed with colonoscopy. The patient understands the nature of the planned procedure, indications, risks, alternatives and potential complications including but not limited to bleeding, infection, perforation, damage to internal organs and possible oversedation/side effects from anesthesia. The patient agrees and gives consent to proceed.  Please refer to procedure notes for findings, recommendations and patient disposition/instructions.     Regis Bill, MD [facility]

## 2023-01-03 NOTE — Op Note (Signed)
[facility] Patient Name: Holly Simon Procedure Date: 01/03/2023 7:11 AM MRN: [id number] Account #: [id number] Date of Birth: 1961-01-26 Admit Type: Outpatient Age: 62 Room: [facility] ENDO ROOM 1 Gender: Female Note Status: Finalized Instrument Name: Prentice Docker [id number] Procedure:             Colonoscopy Indications:           Screening patient at increased risk: Family history of                         1st-degree relative with colorectal cancer at age 62                         years (or older) Providers:             Eather Colas MD, MD Referring MD:          Enid Baas, MD (Referring MD) Medicines:             Monitored Anesthesia Care Complications:         No immediate complications. Procedure:             Pre-Anesthesia Assessment:                        - Prior to the procedure, a History and Physical was                         performed, and patient medications and allergies were                         reviewed. The patient is competent. The risks and                         benefits of the procedure and the sedation options and                         risks were discussed with the patient. All questions                         were answered and informed consent was obtained.                         Patient identification and proposed procedure were                         verified by the physician, the nurse, the                         anesthesiologist, the anesthetist and the technician                         in the endoscopy suite. Mental Status Examination:                         alert and oriented. Airway Examination: normal                         oropharyngeal airway and neck mobility. Respiratory  Examination: clear to auscultation. CV Examination:                         normal. Prophylactic Antibiotics: The patient does not                         require prophylactic antibiotics. Prior                          Anticoagulants: The patient has taken no anticoagulant                         or antiplatelet agents. ASA Grade Assessment: III - A                         patient with severe systemic disease. After reviewing                         the risks and benefits, the patient was deemed in                         satisfactory condition to undergo the procedure. The                         anesthesia plan was to use monitored anesthesia care                         (MAC). Immediately prior to administration of                         medications, the patient was re-assessed for adequacy                         to receive sedatives. The heart rate, respiratory                         rate, oxygen saturations, blood pressure, adequacy of                         pulmonary ventilation, and response to care were                         monitored throughout the procedure. The physical                         status of the patient was re-assessed after the                         procedure.                        After obtaining informed consent, the colonoscope was                         passed under direct vision. Throughout the procedure,                         the patient's blood pressure, pulse, and oxygen  saturations were monitored continuously. The                         Colonoscope was introduced through the anus and                         advanced to the the terminal ileum. The colonoscopy                         was performed without difficulty. The patient                         tolerated the procedure well. The quality of the bowel                         preparation was good. The terminal ileum, ileocecal                         valve, appendiceal orifice, and rectum were                         photographed. Findings:      The perianal and digital rectal examinations were normal.      The terminal ileum appeared normal.      Internal hemorrhoids  were found during retroflexion. The hemorrhoids       were Grade I (internal hemorrhoids that do not prolapse).      The exam was otherwise without abnormality on direct and retroflexion       views. Impression:            - The examined portion of the ileum was normal.                        - Internal hemorrhoids.                        - The examination was otherwise normal on direct and                         retroflexion views.                        - No specimens collected. Recommendation:        - Discharge patient to home.                        - Resume previous diet.                        - Continue present medications.                        - Repeat colonoscopy in 10 years for screening                         purposes.                        - Return to referring physician as previously  scheduled. Procedure Code(s):     --- Professional ---                        Z6109, Colorectal cancer screening; colonoscopy on                         individual at high risk Diagnosis Code(s):     --- Professional ---                        Z80.0, Family history of malignant neoplasm of                         digestive organs                        K64.0, First degree hemorrhoids CPT copyright 2022 American Medical Association. All rights reserved. The codes documented in this report are preliminary and upon coder review may  be revised to meet current compliance requirements. Eather Colas MD, MD 01/03/2023 8:11:12 AM Number of Addenda: 0 Note Initiated On: 01/03/2023 7:11 AM Scope Withdrawal Time: 0 hours 9 minutes 9 seconds  Total Procedure Duration: 0 hours 16 minutes 3 seconds  Estimated Blood Loss:  Estimated blood loss: none.      [facility]

## 2023-01-03 NOTE — Interval H&P Note (Signed)
History and Physical Interval Note:  01/03/2023 7:40 AM  Holly Simon  has presented today for surgery, with the diagnosis of Z12.11 (ICD-10-CM) - Colon cancer screening Z80.0 (ICD-10-CM) - Family history of colon cancer Z83.719 (ICD-10-CM) - Family history of colonic polyps.  The various methods of treatment have been discussed with the patient and family. After consideration of risks, benefits and other options for treatment, the patient has consented to  Procedure(s) with comments: COLONOSCOPY WITH PROPOFOL (N/A) - DM as a surgical intervention.  The patient's history has been reviewed, patient examined, no change in status, stable for surgery.  I have reviewed the patient's chart and labs.  Questions were answered to the patient's satisfaction.     Regis Bill  Ok to proceed with colonoscopy

## 2023-01-03 NOTE — Anesthesia Postprocedure Evaluation (Signed)
Anesthesia Post Note  Patient: Holly Simon  Procedure(s) Performed: COLONOSCOPY WITH PROPOFOL  Patient location during evaluation: PACU Anesthesia Type: General Level of consciousness: awake and alert, oriented and patient cooperative Pain management: pain level controlled Vital Signs Assessment: post-procedure vital signs reviewed and stable Respiratory status: spontaneous breathing, nonlabored ventilation and respiratory function stable Cardiovascular status: blood pressure returned to baseline and stable Postop Assessment: adequate PO intake Anesthetic complications: no   No notable events documented.   Last Vitals:  Vitals:   01/03/23 0806 01/03/23 0816  BP: (!) 92/55 (!) 104/56  Pulse: 72 83  Resp: 18 18  Temp:  (!) 35.6 C  SpO2: 100% 100%    Last Pain:  Vitals:   01/03/23 0816  TempSrc: Temporal  PainSc: 0-No pain                 Reed Breech

## 2023-01-03 NOTE — Transfer of Care (Signed)
Immediate Anesthesia Transfer of Care Note  Patient: Holly Simon  Procedure(s) Performed: COLONOSCOPY WITH PROPOFOL  Patient Location: Endoscopy Unit  Anesthesia Type:General  Level of Consciousness: drowsy and patient cooperative  Airway & Oxygen Therapy: Patient Spontanous Breathing and Patient connected to face mask oxygen  Post-op Assessment: Report given to RN and Post -op Vital signs reviewed and stable  Post vital signs: Reviewed and stable  Last Vitals:  Vitals Value Taken Time  BP 92/55 01/03/23 0806  Temp    Pulse 72 01/03/23 0806  Resp 18 01/03/23 0806  SpO2 100 % 01/03/23 0806    Last Pain:  Vitals:   01/03/23 0806  TempSrc:   PainSc: Asleep         Complications: No notable events documented.

## 2023-01-03 NOTE — Anesthesia Preprocedure Evaluation (Addendum)
Anesthesia Evaluation  Patient identified by MRN, date of birth, ID band Patient awake    Reviewed: Allergy & Precautions, NPO status , Patient's Chart, lab work & pertinent test results  History of Anesthesia Complications Negative for: history of anesthetic complications  Airway Mallampati: I   Neck ROM: Full    Dental   Crowns :   Pulmonary neg pulmonary ROS   Pulmonary exam normal breath sounds clear to auscultation       Cardiovascular hypertension, Normal cardiovascular exam Rhythm:Regular Rate:Normal  ECG 10/24/22:  Sinus bradycardia ST & T wave abnormality, consider inferior ischemia ST & T wave abnormality, consider anterolateral ischemia  Echo 06/29/21:  NORMAL LEFT VENTRICULAR SYSTOLIC FUNCTION  NORMAL RIGHT VENTRICULAR SYSTOLIC FUNCTION  MILD VALVULAR REGURGITATION NO VALVULAR STENOSIS    Neuro/Psych negative neurological ROS     GI/Hepatic ,GERD  ,,  Endo/Other  diabetes, Type 2  Class 3 obesity  Renal/GU negative Renal ROS     Musculoskeletal   Abdominal   Peds  Hematology negative hematology ROS (+)   Anesthesia Other Findings Last dose of Trulicity 12/25/22.  Reproductive/Obstetrics                             Anesthesia Physical Anesthesia Plan  ASA: 3  Anesthesia Plan: General   Post-op Pain Management:    Induction: Intravenous  PONV Risk Score and Plan: 3 and Propofol infusion, TIVA and Treatment may vary due to age or medical condition  Airway Management Planned: Natural Airway  Additional Equipment:   Intra-op Plan:   Post-operative Plan:   Informed Consent: I have reviewed the patients History and Physical, chart, labs and discussed the procedure including the risks, benefits and alternatives for the proposed anesthesia with the patient or authorized representative who has indicated his/her understanding and acceptance.       Plan Discussed  with: CRNA  Anesthesia Plan Comments: (LMA/GETA backup discussed.  Patient consented for risks of anesthesia including but not limited to:  - adverse reactions to medications - damage to eyes, teeth, lips or other oral mucosa - nerve damage due to positioning  - sore throat or hoarseness - damage to heart, brain, nerves, lungs, other parts of body or loss of life  Informed patient about role of CRNA in peri- and intra-operative care.  Patient voiced understanding.)        Anesthesia Quick Evaluation

## 2023-01-04 ENCOUNTER — Encounter: Payer: Self-pay | Admitting: Gastroenterology

## 2023-04-14 ENCOUNTER — Ambulatory Visit
Admission: RE | Admit: 2023-04-14 | Discharge: 2023-04-14 | Disposition: A | Discharge status: Home / Self Care | Source: Ambulatory Visit | Attending: Internal Medicine | Admitting: Internal Medicine

## 2023-04-14 ENCOUNTER — Other Ambulatory Visit: Payer: Self-pay | Admitting: Internal Medicine

## 2023-04-14 DIAGNOSIS — M7989 Other specified soft tissue disorders: Secondary | ICD-10-CM | POA: Insufficient documentation

## 2023-04-14 DIAGNOSIS — M79662 Pain in left lower leg: Secondary | ICD-10-CM

## 2023-08-30 ENCOUNTER — Encounter: Payer: Self-pay | Admitting: General Surgery
